# Patient Record
Sex: Male | Born: 1945 | Race: White | Hispanic: No | Marital: Married | Smoking: Never smoker
Health system: Southern US, Community
[De-identification: ages and names within clinical notes are randomized; demographics above are authoritative.]

## PROBLEM LIST (undated history)

## (undated) DIAGNOSIS — I10 Essential (primary) hypertension: Secondary | ICD-10-CM

## (undated) DIAGNOSIS — C801 Malignant (primary) neoplasm, unspecified: Secondary | ICD-10-CM

## (undated) DIAGNOSIS — E7800 Pure hypercholesterolemia, unspecified: Secondary | ICD-10-CM

## (undated) DIAGNOSIS — G629 Polyneuropathy, unspecified: Secondary | ICD-10-CM

## (undated) DIAGNOSIS — I4891 Unspecified atrial fibrillation: Secondary | ICD-10-CM

## (undated) HISTORY — PX: PENILE PROSTHESIS IMPLANT: SHX240

## (undated) HISTORY — PX: KNEE ARTHROSCOPY W/ MENISCAL REPAIR: SHX1877

## (undated) HISTORY — PX: REPLACEMENT TOTAL KNEE BILATERAL: SUR1225

---

## 2015-03-16 DIAGNOSIS — G629 Polyneuropathy, unspecified: Secondary | ICD-10-CM | POA: Insufficient documentation

## 2015-03-16 DIAGNOSIS — I4819 Other persistent atrial fibrillation: Secondary | ICD-10-CM | POA: Insufficient documentation

## 2015-03-29 ENCOUNTER — Ambulatory Visit: Payer: Self-pay | Attending: Specialist

## 2015-03-29 DIAGNOSIS — G4733 Obstructive sleep apnea (adult) (pediatric): Secondary | ICD-10-CM | POA: Insufficient documentation

## 2015-04-10 DIAGNOSIS — G4733 Obstructive sleep apnea (adult) (pediatric): Secondary | ICD-10-CM | POA: Insufficient documentation

## 2015-05-03 ENCOUNTER — Encounter: Payer: Self-pay | Admitting: Urology

## 2015-05-03 ENCOUNTER — Ambulatory Visit: Payer: Self-pay

## 2015-06-07 DIAGNOSIS — G5603 Carpal tunnel syndrome, bilateral upper limbs: Secondary | ICD-10-CM | POA: Insufficient documentation

## 2015-09-22 ENCOUNTER — Emergency Department
Admission: EM | Admit: 2015-09-22 | Discharge: 2015-09-22 | Disposition: A | Discharge status: Other Acute Inpatient Hospital | Payer: Medicare Other | Attending: Emergency Medicine | Admitting: Emergency Medicine

## 2015-09-22 ENCOUNTER — Emergency Department: Payer: Medicare Other

## 2015-09-22 DIAGNOSIS — I10 Essential (primary) hypertension: Secondary | ICD-10-CM | POA: Diagnosis not present

## 2015-09-22 DIAGNOSIS — M79641 Pain in right hand: Secondary | ICD-10-CM | POA: Diagnosis present

## 2015-09-22 DIAGNOSIS — L03113 Cellulitis of right upper limb: Secondary | ICD-10-CM | POA: Diagnosis not present

## 2015-09-22 HISTORY — DX: Essential (primary) hypertension: I10

## 2015-09-22 HISTORY — DX: Polyneuropathy, unspecified: G62.9

## 2015-09-22 HISTORY — DX: Pure hypercholesterolemia, unspecified: E78.00

## 2015-09-22 LAB — CBC WITH DIFFERENTIAL/PLATELET
BASOS ABS: 0 10*3/uL (ref 0–0.1)
BASOS PCT: 1 %
EOS ABS: 0.1 10*3/uL (ref 0–0.7)
Eosinophils Relative: 2 %
HEMATOCRIT: 36.7 % — AB (ref 40.0–52.0)
HEMOGLOBIN: 12.6 g/dL — AB (ref 13.0–18.0)
Lymphocytes Relative: 9 %
Lymphs Abs: 0.7 10*3/uL — ABNORMAL LOW (ref 1.0–3.6)
MCH: 30.4 pg (ref 26.0–34.0)
MCHC: 34.5 g/dL (ref 32.0–36.0)
MCV: 88 fL (ref 80.0–100.0)
MONO ABS: 0.7 10*3/uL (ref 0.2–1.0)
MONOS PCT: 9 %
NEUTROS ABS: 6.3 10*3/uL (ref 1.4–6.5)
NEUTROS PCT: 79 %
Platelets: 297 10*3/uL (ref 150–440)
RBC: 4.16 MIL/uL — ABNORMAL LOW (ref 4.40–5.90)
RDW: 13.3 % (ref 11.5–14.5)
WBC: 7.9 10*3/uL (ref 3.8–10.6)

## 2015-09-22 LAB — COMPREHENSIVE METABOLIC PANEL
ALBUMIN: 3.6 g/dL (ref 3.5–5.0)
ALT: 27 U/L (ref 17–63)
ANION GAP: 9 (ref 5–15)
AST: 30 U/L (ref 15–41)
Alkaline Phosphatase: 63 U/L (ref 38–126)
BILIRUBIN TOTAL: 1.1 mg/dL (ref 0.3–1.2)
BUN: 17 mg/dL (ref 6–20)
CHLORIDE: 96 mmol/L — AB (ref 101–111)
CO2: 34 mmol/L — AB (ref 22–32)
Calcium: 9.5 mg/dL (ref 8.9–10.3)
Creatinine, Ser: 1.33 mg/dL — ABNORMAL HIGH (ref 0.61–1.24)
GFR calc Af Amer: >60 mL/min (ref >60)
GFR calc non Af Amer: 53 mL/min — ABNORMAL LOW (ref >60)
GLUCOSE: 91 mg/dL (ref 65–99)
POTASSIUM: 3.1 mmol/L — AB (ref 3.5–5.1)
SODIUM: 139 mmol/L (ref 135–145)
Total Protein: 7.6 g/dL (ref 6.5–8.1)

## 2015-09-22 MED ORDER — MORPHINE SULFATE (PF) 4 MG/ML IV SOLN
4.0000 mg | Freq: Once | INTRAVENOUS | Status: AC
  Administered 2015-09-22: 4 mg via INTRAVENOUS
  Filled 2015-09-22: qty 1

## 2015-09-22 MED ORDER — SODIUM CHLORIDE 0.9 % IV BOLUS (SEPSIS)
1000.0000 mL | Freq: Once | INTRAVENOUS | Status: AC
  Administered 2015-09-22: 1000 mL via INTRAVENOUS

## 2015-09-22 MED ORDER — CEFAZOLIN SODIUM 1-5 GM-% IV SOLN
1.0000 g | Freq: Once | INTRAVENOUS | Status: AC
  Administered 2015-09-22: 1 g via INTRAVENOUS
  Filled 2015-09-22: qty 50

## 2015-09-22 MED ORDER — ONDANSETRON HCL 4 MG/2ML IJ SOLN
4.0000 mg | Freq: Once | INTRAMUSCULAR | Status: AC
  Administered 2015-09-22: 4 mg via INTRAVENOUS
  Filled 2015-09-22: qty 2

## 2015-09-22 NOTE — ED Notes (Signed)
Report given to [facility]. Swelling to hand and wrist noted to be much better. Decreased swelling and hand no longer white. Pt able to move wrist now

## 2015-09-22 NOTE — ED Notes (Signed)
Pt's fingers of R hand noted to be warmer than on first assessment. Cap refill > 3 seconds, no change in pt ability to feel sensation, pulses 2+. Pt maintains minimal movement at this time.

## 2015-09-22 NOTE — ED Notes (Addendum)
Right hand swelling and pain. Pt right hand was pierced with barb wire on Wednesday. Pt states he was seen at ER in [place] yesterday, xray, tetanus shot and given antibiotics. Xray taken. Pt alert and oriented X4, active, cooperative, pt in NAD. RR even and unlabored, color WNL.

## 2015-09-22 NOTE — ED Notes (Signed)
Pt's fingers warm, cap refill about 2 sec, sensation intact, limited movement due to swelling

## 2015-09-22 NOTE — ED Notes (Addendum)
Right radial pulse palpable, moderate. Full ROM, sensation, cap refill approx 2 seconds. Pt states that swelling is no worse than yesterday but that the pain has gotten worse.

## 2015-09-22 NOTE — ED Provider Notes (Signed)
[facility] Emergency Department Provider Note  ____________________________________________  Time seen: Approximately 5:47 PM  I have reviewed the triage vital signs and the nursing notes.   HISTORY  Chief Complaint Hand Pain    HPI Patrick Yang is a 69 y.o. male who presents emergency department for worsening of his right hand swelling. Per the patient he was injured 3 days ago on fresh section of barb wire. He states that he was trying to run a fence line when the barb are retracted catching the dorsal aspect of his right hand. Initially the patient did not seek medical care was able control all symptoms with over-the-counter medications. The patient states that the area began to become "red and swollen" and he proceeded to be evaluated in the emergency department at [facility]. His hand was x-rayed and resulted negative for fractures or foreign body. Patient was given an updated tetanus booster as well as oral antibiotics (levaquin). Patient states over the last 24 hours the swelling has greatly increased as well as the pain. He states he is no longer able to move his digits. He reports decreased sensation to the digits as well. He denies any fevers or chills, headaches, neck pain, chest pain, shortness breath, abdominal pain, nausea or vomiting.He reports that the pain is "out of this world" severe, constant, unrelieved by any medications.   Past Medical History  Diagnosis Date  . Hypertension   . Neuropathy ([place])   . Hypercholesteremia     There are no active problems to display for this patient.   History reviewed. No pertinent past surgical history.  No current outpatient prescriptions on file.  Allergies Review of patient's allergies indicates no known allergies.  No family history on file.  Social History Social History  Substance Use Topics  . Smoking status: Never Smoker   . Smokeless tobacco: None  . Alcohol Use: No    Review of  Systems Constitutional: No fever/chills Eyes: No visual changes. ENT: No sore throat. Cardiovascular: Denies chest pain. Respiratory: Denies shortness of breath. Gastrointestinal: No abdominal pain.  No nausea, no vomiting.  No diarrhea.  No constipation. Genitourinary: Negative for dysuria. Musculoskeletal: Negative for back pain. Skin: Negative for rash. Endorses right hand swelling with wounds to the dorsal aspect of right hand. Neurological: Negative for headaches, focal weakness or numbness.  10-point ROS otherwise negative.  ____________________________________________   PHYSICAL EXAM:  VITAL SIGNS: ED Triage Vitals  Enc Vitals Group     BP 09/22/15 1628 143/77 mmHg     Pulse Rate 09/22/15 1628 70     Resp 09/22/15 1628 18     Temp 09/22/15 1628 98.2 F (36.8 C)     Temp Source 09/22/15 1628 Oral     SpO2 09/22/15 1628 97 %     Weight 09/22/15 1628 245 lb (111.131 kg)     Height 09/22/15 1628 5\' 11"  (1.803 m)     Head Cir --      Peak Flow --      Pain Score 09/22/15 1629 8     Pain Loc --      Pain Edu? --      Excl. in [place]? --     Constitutional: Alert and oriented. Well appearing and in no acute distress. Eyes: Conjunctivae are normal. PERRL. EOMI. Head: Atraumatic. Nose: No congestion/rhinnorhea. Mouth/Throat: Mucous membranes are moist.  Oropharynx non-erythematous. Neck: No stridor.   Cardiovascular: Normal rate, regular rhythm. Grossly normal heart sounds.  Good peripheral circulation. Respiratory:  Normal respiratory effort.  No retractions. Lungs CTAB. Gastrointestinal: Soft and nontender. No distention. No abdominal bruits. No CVA tenderness. Musculoskeletal: No lower extremity tenderness nor edema.  No joint effusions. Neurologic:  Normal speech and language. No gross focal neurologic deficits are appreciated. No gait instability. Skin:  Skin is warm, dry and intact. No rash noted. There is gross edema noted to the right hand from the PIP joint of all  digits extending to midforearm. Edema is circumferential in nature. There are scabbed wounds noted to the dorsal and medial aspect of his hand. Patient does not have any range of motion to his digits. Sensation is decreased when compared with left hand. Capillary refill is greater than 3 seconds. Radial pulse is barely palpated. Palpation to area elicits pain. No fluctuance noted. Multiple areas of would like density are palpated. Passive range of motion elicits severe pain. No streaking is noted. Psychiatric: Mood and affect are normal. Speech and behavior are normal.  ____________________________________________   LABS (all labs ordered are listed, but only abnormal results are displayed)  Labs Reviewed  CBC WITH DIFFERENTIAL/PLATELET - Abnormal; Notable for the following:    RBC 4.16 (*)    Hemoglobin 12.6 (*)    HCT 36.7 (*)    Lymphs Abs 0.7 (*)    All other components within normal limits  COMPREHENSIVE METABOLIC PANEL - Abnormal; Notable for the following:    Potassium 3.1 (*)    Chloride 96 (*)    CO2 34 (*)    Creatinine, Ser 1.33 (*)    GFR calc non Af Amer 53 (*)    All other components within normal limits   ____________________________________________  EKG   ____________________________________________  RADIOLOGY  Right hand x-ray Impression: Soft tissue swelling. No acute osseous abnormality. No foreign bodies.  Images were personally reviewed by myself. ____________________________________________   PROCEDURES  Procedure(s) performed: None  Critical Care performed: No  ____________________________________________   INITIAL IMPRESSION / ASSESSMENT AND PLAN / ED COURSE  Pertinent labs & imaging results that were available during my care of the patient were reviewed by me and considered in my medical decision making (see chart for details).  After initial assessment and evaluation, Dr. Cinda Quest was consulted and he personally came and saw the patient as  well. Patient's presentation was concerning for compartment syndrome to right hand. Swelling is circumferential, with decreased sensation and capillary refill. He should fingers are fixed in the extended position and he is unable to flex at this time. Patient has pain out of proportion. Multiple hard wood like sensations to palpation. At this time it was determined that patient would need to be transferred for further care. Upon discussion with [facility] transfer center, Dr. Sherlie Ban, the attending hand surgeon requested that the Orthopedic surgeon here be consulted for possible immediate surgical intervention. Dr. Rudene Christians was paged and presented to the emergency department and evaluate patient. He determined that this time the pressure did not need to be relieved surgically. He recommended further care at [facility]. Transfer center was contacted and arrangements are made to transfer patient to the [facility] for further evaluation and care.  In the emergency department the patient was given pain medication. IV as well as further antibiotics. Labs returned without any acute abnormality. X-ray returns with no acute osseous abnormalities, no retained foreign body, but significant soft tissue swelling. Patient will be diagnosed with cellulitis of the right hand.  All findings and diagnosis of this as well as treatment course has been relayed to  the transfer center. Patient is transferred to the care of [place] ground transport team.   ____________________________________________   FINAL CLINICAL IMPRESSION(S) / ED DIAGNOSES  Final diagnoses:  Cellulitis of right upper extremity      Darletta Moll, PA-C 09/22/15 2034  Nena Polio, MD 09/23/15 [phone]

## 2015-11-27 ENCOUNTER — Other Ambulatory Visit: Payer: Self-pay | Admitting: Internal Medicine

## 2015-11-27 DIAGNOSIS — I4891 Unspecified atrial fibrillation: Secondary | ICD-10-CM

## 2015-11-27 DIAGNOSIS — N183 Chronic kidney disease, stage 3 unspecified: Secondary | ICD-10-CM

## 2015-12-04 ENCOUNTER — Ambulatory Visit: Admission: RE | Admit: 2015-12-04 | Payer: Medicare Other | Source: Ambulatory Visit

## 2015-12-04 ENCOUNTER — Ambulatory Visit: Payer: Medicare Other

## 2015-12-04 ENCOUNTER — Ambulatory Visit: Payer: Medicare Other | Attending: Internal Medicine

## 2015-12-19 ENCOUNTER — Ambulatory Visit: Payer: Medicare Other

## 2015-12-24 ENCOUNTER — Ambulatory Visit: Admission: RE | Admit: 2015-12-24 | Payer: Medicare Other | Source: Ambulatory Visit

## 2015-12-25 ENCOUNTER — Ambulatory Visit
Admission: RE | Admit: 2015-12-25 | Discharge: 2015-12-25 | Disposition: A | Discharge status: Home / Self Care | Payer: No Typology Code available for payment source | Source: Ambulatory Visit | Attending: Internal Medicine | Admitting: Internal Medicine

## 2015-12-25 ENCOUNTER — Other Ambulatory Visit
Admission: RE | Admit: 2015-12-25 | Discharge: 2015-12-25 | Disposition: A | Discharge status: Home / Self Care | Payer: No Typology Code available for payment source | Source: Ambulatory Visit | Attending: Internal Medicine | Admitting: Internal Medicine

## 2015-12-25 DIAGNOSIS — N183 Chronic kidney disease, stage 3 unspecified: Secondary | ICD-10-CM

## 2015-12-25 DIAGNOSIS — I4891 Unspecified atrial fibrillation: Secondary | ICD-10-CM | POA: Diagnosis not present

## 2015-12-25 LAB — BASIC METABOLIC PANEL
ANION GAP: 9 (ref 5–15)
BUN: 14 mg/dL (ref 6–20)
CHLORIDE: 101 mmol/L (ref 101–111)
CO2: 30 mmol/L (ref 22–32)
Calcium: 9.6 mg/dL (ref 8.9–10.3)
Creatinine, Ser: 1.16 mg/dL (ref 0.61–1.24)
GFR calc Af Amer: >60 mL/min (ref >60)
GFR calc non Af Amer: >60 mL/min (ref >60)
GLUCOSE: 63 mg/dL — AB (ref 65–99)
POTASSIUM: 3.4 mmol/L — AB (ref 3.5–5.1)
Sodium: 140 mmol/L (ref 135–145)

## 2015-12-25 LAB — MAGNESIUM: Magnesium: 2 mg/dL (ref 1.7–2.4)

## 2015-12-25 NOTE — Progress Notes (Signed)
*  PRELIMINARY RESULTS* Echocardiogram 2D Echocardiogram has been performed.  Laqueta Jean Hege 12/25/2015, 10:31 AM

## 2016-02-08 DIAGNOSIS — M19031 Primary osteoarthritis, right wrist: Secondary | ICD-10-CM | POA: Insufficient documentation

## 2016-10-02 ENCOUNTER — Encounter
Admission: RE | Disposition: A | Discharge status: Home / Self Care | Payer: Self-pay | Source: Ambulatory Visit | Attending: Cardiology

## 2016-10-02 ENCOUNTER — Ambulatory Visit
Admission: RE | Admit: 2016-10-02 | Discharge: 2016-10-02 | Disposition: A | Discharge status: Home / Self Care | Payer: Medicare Other | Source: Ambulatory Visit | Attending: Cardiology | Admitting: Cardiology

## 2016-10-02 ENCOUNTER — Ambulatory Visit: Payer: Medicare Other | Admitting: Certified Registered Nurse Anesthetist

## 2016-10-02 ENCOUNTER — Encounter: Payer: Self-pay | Admitting: *Deleted

## 2016-10-02 DIAGNOSIS — E7800 Pure hypercholesterolemia, unspecified: Secondary | ICD-10-CM | POA: Insufficient documentation

## 2016-10-02 DIAGNOSIS — Z79899 Other long term (current) drug therapy: Secondary | ICD-10-CM | POA: Diagnosis not present

## 2016-10-02 DIAGNOSIS — I2510 Atherosclerotic heart disease of native coronary artery without angina pectoris: Secondary | ICD-10-CM | POA: Insufficient documentation

## 2016-10-02 DIAGNOSIS — Z8546 Personal history of malignant neoplasm of prostate: Secondary | ICD-10-CM | POA: Insufficient documentation

## 2016-10-02 DIAGNOSIS — Z7982 Long term (current) use of aspirin: Secondary | ICD-10-CM | POA: Diagnosis not present

## 2016-10-02 DIAGNOSIS — I4891 Unspecified atrial fibrillation: Secondary | ICD-10-CM | POA: Diagnosis present

## 2016-10-02 DIAGNOSIS — I10 Essential (primary) hypertension: Secondary | ICD-10-CM | POA: Insufficient documentation

## 2016-10-02 DIAGNOSIS — G629 Polyneuropathy, unspecified: Secondary | ICD-10-CM | POA: Diagnosis not present

## 2016-10-02 DIAGNOSIS — I480 Paroxysmal atrial fibrillation: Secondary | ICD-10-CM | POA: Insufficient documentation

## 2016-10-02 DIAGNOSIS — Z7901 Long term (current) use of anticoagulants: Secondary | ICD-10-CM | POA: Diagnosis not present

## 2016-10-02 DIAGNOSIS — Z96659 Presence of unspecified artificial knee joint: Secondary | ICD-10-CM | POA: Insufficient documentation

## 2016-10-02 DIAGNOSIS — G4730 Sleep apnea, unspecified: Secondary | ICD-10-CM | POA: Insufficient documentation

## 2016-10-02 DIAGNOSIS — E785 Hyperlipidemia, unspecified: Secondary | ICD-10-CM | POA: Diagnosis not present

## 2016-10-02 DIAGNOSIS — J431 Panlobular emphysema: Secondary | ICD-10-CM | POA: Insufficient documentation

## 2016-10-02 HISTORY — PX: ELECTROPHYSIOLOGIC STUDY: SHX172A

## 2016-10-02 HISTORY — DX: Unspecified atrial fibrillation: I48.91

## 2016-10-02 SURGERY — CARDIOVERSION (CATH LAB)
Anesthesia: Choice

## 2016-10-02 SURGERY — CARDIOVERSION (CATH LAB)
Anesthesia: General

## 2016-10-02 MED ORDER — SODIUM CHLORIDE 0.9 % IV SOLN
INTRAVENOUS | Status: DC
  Administered 2016-10-02 (×2): via INTRAVENOUS

## 2016-10-02 MED ORDER — ONDANSETRON HCL 4 MG/2ML IJ SOLN: 4.0000 mg | Freq: Once | INTRAMUSCULAR | Status: DC | PRN

## 2016-10-02 MED ORDER — PROPOFOL 10 MG/ML IV BOLUS
INTRAVENOUS | Status: DC | PRN
  Administered 2016-10-02: 10 mg via INTRAVENOUS
  Administered 2016-10-02: 40 mg via INTRAVENOUS

## 2016-10-02 MED ORDER — PROPOFOL 10 MG/ML IV BOLUS
INTRAVENOUS | Status: AC
  Filled 2016-10-02: qty 20

## 2016-10-02 MED ORDER — FENTANYL CITRATE (PF) 100 MCG/2ML IJ SOLN: 25.0000 ug | INTRAMUSCULAR | Status: DC | PRN

## 2016-10-02 NOTE — Anesthesia Postprocedure Evaluation (Signed)
Anesthesia Post Note  Patient: Patrick Bubier Sr.  Procedure(s) Performed: Procedure(s) (LRB): CARDIOVERSION (N/A)  Patient location during evaluation: PACU Anesthesia Type: General Level of consciousness: awake and alert and oriented Pain management: pain level controlled Vital Signs Assessment: post-procedure vital signs reviewed and stable Respiratory status: spontaneous breathing Cardiovascular status: blood pressure returned to baseline Anesthetic complications: no     Last Vitals:  Vitals:   10/02/16 0750 10/02/16 0800  BP: 122/79 126/73  Pulse: (!) 46 (!) 49  Resp: 13 16  Temp:      Last Pain: There were no vitals filed for this visit.               [name]

## 2016-10-02 NOTE — Transfer of Care (Signed)
Immediate Anesthesia Transfer of Care Note  Patient: Patrick Griffins Sr.  Procedure(s) Performed: Procedure(s): CARDIOVERSION (N/A)  Patient Location: PACU  Anesthesia Type:General  Level of Consciousness: sedated  Airway & Oxygen Therapy: Patient Spontanous Breathing and Patient connected to nasal cannula oxygen  Post-op Assessment: Report given to RN and Post -op Vital signs reviewed and stable  Post vital signs: Reviewed and stable  Last Vitals:  Vitals:   10/02/16 0654 10/02/16 0740  BP: 137/89 133/65  Pulse: (!) 48 (!) 56  Resp: 18 (!) 9  Temp: 36.6 C     Last Pain: There were no vitals filed for this visit.       Complications: No apparent anesthesia complications

## 2016-10-02 NOTE — Procedures (Signed)
Procedure:  DCCV Indication: symptomatic afib Sedation: Propofol deep sedation per Dept. Of ANesthesia  After informed consent, time out protocol and adequate sedation, patient received a 120J synchronized Biphasic DC shock to sinus rhythm. No immediate complications.

## 2016-10-02 NOTE — Anesthesia Procedure Notes (Signed)
Date/Time: 10/02/2016 7:30 AM Performed by: Johnna Acosta Pre-anesthesia Checklist: Patient identified, Emergency Drugs available, Suction available, Patient being monitored and Timeout performed Patient Re-evaluated:Patient Re-evaluated prior to inductionOxygen Delivery Method: Nasal cannula

## 2016-10-02 NOTE — H&P (Signed)
Chief Complaint: Chief Complaint  Patient presents with  . Follow-up  talk about Cardioversion  . Shortness of Breath  labored shortness of breath  . Fatigue  Im always tired  . other  went to the [place] and blood pressures different in arms what could that be  Date of Service: 09/19/2016 Date of Birth: 1946-09-18 PCP: Idelle Crouch, MD, MD  History of Present Illness: Patrick Yang is a 70 y.o.male patient who has a history of atrial fibrillation, hypertension and hyperlipidemia who presents for follow-up visit. Patient has had intermittent atrial fibrillation. He had been cardioverted to sinus rhythm but at last visit was in atrial fibrillation. He also underwent a sleep study which suggested sleep apnea. He is attempting use CPAP for his sleep apnea. Electrocardiogram today reveals probable atrial fibrillation although baseline makes it somewhat difficult to determine. He has a right bundle-branch block. He remains on Xarelto for anticoagulation and amiodarone at 200 mg daily. His blood pressure is being aggressively controlled with spironolactone-hydrochlorothiazide, Hytrin, labetalol, minoxidil, amlodipine.  Past Medical and Surgical History  Past Medical History Past Medical History:  Diagnosis Date  . ASCVD (arteriosclerotic cardiovascular disease)  . COPD (chronic obstructive pulmonary disease) , unspecified (CMS-HCC)  . Hyperlipidemia, unspecified  . Hypertension  . Neuropathy  . Prostate cancer (CMS-HCC)  . Sleep apnea   Past Surgical History He has a past surgical history that includes Replacement total knee; Joint replacement; insertion penile prosthesis; Prostate Brachy Therapy; Colonoscopy; and carpectomy (Right, 02/27/2016).   Medications and Allergies  Current Medications  Current Outpatient Prescriptions  Medication Sig Dispense Refill  . amitriptyline (ELAVIL) 25 MG tablet Take 1 tablet (25 mg total) by mouth nightly. 30 tablet 11  . amLODIPine (NORVASC) 10 MG  tablet Take 1 tablet (10 mg total) by mouth 2 (two) times daily. (Patient taking differently: Take 10 mg by mouth once daily.  ) 60 tablet 5  . aspirin 81 MG EC tablet Take 81 mg by mouth once daily.  . hydrALAZINE (APRESOLINE) 50 MG tablet Take 50 mg by mouth 3 (three) times daily.  . hydroCHLOROthiazide (MICROZIDE) 12.5 mg capsule Take 12.5 mg by mouth once daily.  Marland Kitchen omeprazole (PRILOSEC OTC) 20 MG tablet Take 20 mg by mouth once daily.  . pravastatin (PRAVACHOL) 80 MG tablet Take 80 mg by mouth nightly.  . pregabalin (LYRICA) 150 MG capsule Take 150 mg by mouth 2 (two) times daily.  . rivaroxaban (XARELTO) 20 mg tablet Take 20 mg by mouth daily with dinner.  . sertraline (ZOLOFT) 100 MG tablet Take 100 mg by mouth once daily.  Marland Kitchen spironolactone (ALDACTONE) 25 MG tablet Take 25 mg by mouth once daily.  Marland Kitchen terazosin (HYTRIN) 10 MG capsule Take 10 mg by mouth nightly.  . zolpidem (AMBIEN) 10 mg tablet Take 10 mg by mouth nightly as needed for Sleep.  Marland Kitchen albuterol (VOSPIRE ER) 8 MG 12 hr tablet Take 8 mg by mouth every 12 (twelve) hours.  Marland Kitchen diltiazem (CARDIZEM CD) 120 MG XR capsule Take 1 capsule (120 mg total) by mouth once daily. 30 capsule 11   No current facility-administered medications for this visit.   Allergies: Patient has no known allergies.  Social and Family History  Social History reports that he has never smoked. He has never used smokeless tobacco. He reports that he does not drink alcohol or use drugs.  Family History Family History  Problem Relation Age of Onset  . Lung cancer Mother  . Lung cancer Father  .  Anesthesia problems Neg Hx   Review of Systems  Review of Systems  Constitutional: Negative for chills, diaphoresis, fever, malaise/fatigue and weight loss.  HENT: Negative for congestion, ear discharge, hearing loss and tinnitus.  Eyes: Negative for blurred vision.  Respiratory: Positive for shortness of breath. Negative for cough, hemoptysis, sputum production  and wheezing.  Cardiovascular: Negative for chest pain, palpitations, orthopnea, claudication, leg swelling and PND.  Gastrointestinal: Negative for abdominal pain, blood in stool, constipation, diarrhea, heartburn, melena, nausea and vomiting.  Genitourinary: Negative for dysuria, frequency, hematuria and urgency.  Musculoskeletal: Negative for back pain, falls, joint pain and myalgias.  Skin: Negative for itching and rash.  Neurological: Negative for dizziness, tingling, focal weakness, loss of consciousness, weakness and headaches.  Endo/Heme/Allergies: Negative for polydipsia. Does not bruise/bleed easily.  Psychiatric/Behavioral: Negative for depression, memory loss and substance abuse. The patient is not nervous/anxious.    Physical Examination   Vitals: BP 132/70  Pulse 64  Ht 182.9 cm (6')  Wt (!) 114.6 kg (252 lb 9.6 oz)  BMI 34.26 kg/m  Ht:182.9 cm (6') Wt:(!) 114.6 kg (252 lb 9.6 oz) [id number] surface area is 2.41 meters squared. Body mass index is 34.26 kg/m.  Wt Readings from Last 3 Encounters:  09/19/16 (!) 114.6 kg (252 lb 9.6 oz)  04/10/16 (!) 112.5 kg (248 lb)  03/13/16 (!) 112.5 kg (248 lb)   BP Readings from Last 3 Encounters:  09/19/16 132/70  04/10/16 126/80  03/13/16 143/81   General appearance appears in no acute distress  Head Mouth and Eye exam Normocephalic, without obvious abnormality, atraumatic Dentition is good Eyes appear anicteric   Neck exam Thyroid: normal  Nodes: no obvious adenopathy  LUNGS Breath Sounds: Normal Percussion: Normal  CARDIOVASCULAR JVP CV wave: no HJR: no Elevation at 90 degrees: None Carotid Pulse: normal pulsation bilaterally Bruit: None Apex: apical impulse normal  Auscultation Rhythm: normal sinus rhythm S1: normal S2: normal Clicks: no Rub: no Murmurs: no murmurs  Gallop: None ABDOMEN Liver enlargement: no Pulsatile aorta: no Ascites: no Bruits: no  EXTREMITIES Clubbing: no Edema:  trace to 1+ bilateral pedal edema Pulses: peripheral pulses symmetrical Femoral Bruits: no Amputation: no SKIN Rash: no Cyanosis: no Embolic phemonenon: no Bruising: no NEURO Alert and Oriented to person, place and time: yes Non focal: yes  PSYCH: Pt appears to have normal affect  LABS REVIEWED  Lab Results  Component Value Date  CREATININE 1.1 09/27/2015  BUN 10 09/27/2015  NA 142 09/27/2015  K 3.4 (L) 09/27/2015  CL 99 09/27/2015  CO2 36 (H) 09/27/2015   Lab Results  Component Value Date  ALT 35 07/09/2015  AST 33 07/09/2015  ALKPHOS 52 07/09/2015   Lab Results  Component Value Date  TSH 1.825 03/26/2015   Assessment and Plan   70 y.o. male with  ICD-10-CM ICD-9-CM  1. Paroxysmal a-fib-currently is in sinus rhythm. He remains on amiodarone and Xarelto at 20 mg daily. Will continue with Xarelto at this dose and follow. Continue with CPAP. Consider proceeding with cardioversion. I48.0 427.31      2. ASCVD (arteriosclerotic cardiovascular disease) I25.10 429.2  440.9  3. Pure hypercholesterolemia E78.0 272.0  4. Essential hypertension with goal blood pressure less than 140/90 I10 401.9  5. PAF (paroxysmal atrial fibrillation) I48.0 427.31  6. 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 Sleep apnea-patient will need CPAP for his sleep apnea which was diagnosed by a sleep study.  Return in about 3 weeks (around 10/10/2016).  These notes generated with  voice recognition software. I apologize for typographical errors.  Sydnee Levans, MD    H and P reviewed. No change

## 2016-10-02 NOTE — Anesthesia Preprocedure Evaluation (Addendum)
Anesthesia Evaluation  Patient identified by MRN, date of birth, ID band Patient awake    Reviewed: Allergy & Precautions, NPO status , Patient's Chart, lab work & pertinent test results, reviewed documented beta blocker date and time   Airway Mallampati: III       Dental  (+) Partial Upper   Pulmonary neg pulmonary ROS,    Pulmonary exam normal        Cardiovascular hypertension, Pt. on medications and Pt. on home beta blockers Normal cardiovascular exam+ dysrhythmias Atrial Fibrillation      Neuro/Psych Peripheral neuropathy negative psych ROS   GI/Hepatic negative GI ROS, Neg liver ROS,   Endo/Other  negative endocrine ROS  Renal/GU negative Renal ROS  negative genitourinary   Musculoskeletal negative musculoskeletal ROS (+)   Abdominal Normal abdominal exam  (+)   Peds negative pediatric ROS (+)  Hematology negative hematology ROS (+)   Anesthesia Other Findings   Reproductive/Obstetrics                            Anesthesia Physical Anesthesia Plan  ASA: III  Anesthesia Plan: General   Post-op Pain Management:    Induction: Intravenous  Airway Management Planned: Nasal Cannula  Additional Equipment:   Intra-op Plan:   Post-operative Plan:   Informed Consent: I have reviewed the patients History and Physical, chart, labs and discussed the procedure including the risks, benefits and alternatives for the proposed anesthesia with the patient or authorized representative who has indicated his/her understanding and acceptance.   Dental advisory given  Plan Discussed with: CRNA and Surgeon  Anesthesia Plan Comments:         Anesthesia Quick Evaluation

## 2016-10-03 NOTE — Discharge Summary (Signed)
Cardioverted to [facility]. OK for discharge. Follow up in 1 week.

## 2016-12-19 DIAGNOSIS — I2510 Atherosclerotic heart disease of native coronary artery without angina pectoris: Secondary | ICD-10-CM | POA: Insufficient documentation

## 2017-02-04 DIAGNOSIS — Z7901 Long term (current) use of anticoagulants: Secondary | ICD-10-CM | POA: Insufficient documentation

## 2017-07-20 ENCOUNTER — Inpatient Hospital Stay
Admission: EM | Admit: 2017-07-20 | Discharge: 2017-07-22 | DRG: 684 | Disposition: A | Discharge status: Home / Self Care | Payer: Medicare Other | Attending: Internal Medicine | Admitting: Internal Medicine

## 2017-07-20 ENCOUNTER — Emergency Department: Payer: Medicare Other

## 2017-07-20 DIAGNOSIS — E860 Dehydration: Secondary | ICD-10-CM | POA: Diagnosis present

## 2017-07-20 DIAGNOSIS — Z801 Family history of malignant neoplasm of trachea, bronchus and lung: Secondary | ICD-10-CM

## 2017-07-20 DIAGNOSIS — K279 Peptic ulcer, site unspecified, unspecified as acute or chronic, without hemorrhage or perforation: Secondary | ICD-10-CM | POA: Diagnosis present

## 2017-07-20 DIAGNOSIS — I482 Chronic atrial fibrillation: Secondary | ICD-10-CM | POA: Diagnosis present

## 2017-07-20 DIAGNOSIS — I959 Hypotension, unspecified: Secondary | ICD-10-CM | POA: Diagnosis present

## 2017-07-20 DIAGNOSIS — I129 Hypertensive chronic kidney disease with stage 1 through stage 4 chronic kidney disease, or unspecified chronic kidney disease: Secondary | ICD-10-CM | POA: Diagnosis present

## 2017-07-20 DIAGNOSIS — N183 Chronic kidney disease, stage 3 (moderate): Secondary | ICD-10-CM | POA: Diagnosis present

## 2017-07-20 DIAGNOSIS — Z7982 Long term (current) use of aspirin: Secondary | ICD-10-CM | POA: Diagnosis not present

## 2017-07-20 DIAGNOSIS — G8929 Other chronic pain: Secondary | ICD-10-CM | POA: Diagnosis present

## 2017-07-20 DIAGNOSIS — Z23 Encounter for immunization: Secondary | ICD-10-CM

## 2017-07-20 DIAGNOSIS — Z96653 Presence of artificial knee joint, bilateral: Secondary | ICD-10-CM | POA: Diagnosis present

## 2017-07-20 DIAGNOSIS — N400 Enlarged prostate without lower urinary tract symptoms: Secondary | ICD-10-CM | POA: Diagnosis present

## 2017-07-20 DIAGNOSIS — R531 Weakness: Secondary | ICD-10-CM

## 2017-07-20 DIAGNOSIS — N179 Acute kidney failure, unspecified: Secondary | ICD-10-CM | POA: Diagnosis present

## 2017-07-20 DIAGNOSIS — I480 Paroxysmal atrial fibrillation: Secondary | ICD-10-CM | POA: Diagnosis present

## 2017-07-20 DIAGNOSIS — G629 Polyneuropathy, unspecified: Secondary | ICD-10-CM | POA: Diagnosis present

## 2017-07-20 DIAGNOSIS — K5730 Diverticulosis of large intestine without perforation or abscess without bleeding: Secondary | ICD-10-CM | POA: Diagnosis present

## 2017-07-20 DIAGNOSIS — D72829 Elevated white blood cell count, unspecified: Secondary | ICD-10-CM | POA: Diagnosis present

## 2017-07-20 DIAGNOSIS — Z7901 Long term (current) use of anticoagulants: Secondary | ICD-10-CM

## 2017-07-20 DIAGNOSIS — E785 Hyperlipidemia, unspecified: Secondary | ICD-10-CM | POA: Diagnosis present

## 2017-07-20 DIAGNOSIS — E876 Hypokalemia: Secondary | ICD-10-CM | POA: Diagnosis present

## 2017-07-20 DIAGNOSIS — I2510 Atherosclerotic heart disease of native coronary artery without angina pectoris: Secondary | ICD-10-CM | POA: Diagnosis present

## 2017-07-20 DIAGNOSIS — I700 Atherosclerosis of aorta: Secondary | ICD-10-CM | POA: Diagnosis present

## 2017-07-20 DIAGNOSIS — N189 Chronic kidney disease, unspecified: Secondary | ICD-10-CM

## 2017-07-20 DIAGNOSIS — Z960 Presence of urogenital implants: Secondary | ICD-10-CM | POA: Diagnosis present

## 2017-07-20 DIAGNOSIS — E7800 Pure hypercholesterolemia, unspecified: Secondary | ICD-10-CM | POA: Diagnosis present

## 2017-07-20 DIAGNOSIS — Z9884 Bariatric surgery status: Secondary | ICD-10-CM | POA: Diagnosis not present

## 2017-07-20 HISTORY — DX: Malignant (primary) neoplasm, unspecified: C80.1

## 2017-07-20 LAB — CBC
HCT: 43.2 % (ref 40.0–52.0)
Hemoglobin: 15.1 g/dL (ref 13.0–18.0)
MCH: 31.5 pg (ref 26.0–34.0)
MCHC: 34.9 g/dL (ref 32.0–36.0)
MCV: 90.2 fL (ref 80.0–100.0)
PLATELETS: 261 10*3/uL (ref 150–440)
RBC: 4.79 MIL/uL (ref 4.40–5.90)
RDW: 13.5 % (ref 11.5–14.5)
WBC: 12 10*3/uL — ABNORMAL HIGH (ref 3.8–10.6)

## 2017-07-20 LAB — BASIC METABOLIC PANEL
ANION GAP: 16 — AB (ref 5–15)
BUN: 47 mg/dL — ABNORMAL HIGH (ref 6–20)
CALCIUM: 10.4 mg/dL — AB (ref 8.9–10.3)
CO2: 26 mmol/L (ref 22–32)
Chloride: 95 mmol/L — ABNORMAL LOW (ref 101–111)
Creatinine, Ser: 2.44 mg/dL — ABNORMAL HIGH (ref 0.61–1.24)
GFR, EST AFRICAN AMERICAN: 29 mL/min — AB (ref >60)
GFR, EST NON AFRICAN AMERICAN: 25 mL/min — AB (ref >60)
Glucose, Bld: 116 mg/dL — ABNORMAL HIGH (ref 65–99)
Potassium: 3.2 mmol/L — ABNORMAL LOW (ref 3.5–5.1)
SODIUM: 137 mmol/L (ref 135–145)

## 2017-07-20 LAB — GLUCOSE, CAPILLARY: GLUCOSE-CAPILLARY: 110 mg/dL — AB (ref 65–99)

## 2017-07-20 LAB — MAGNESIUM: MAGNESIUM: 2.2 mg/dL (ref 1.7–2.4)

## 2017-07-20 MED ORDER — INFLUENZA VAC SPLIT HIGH-DOSE 0.5 ML IM SUSY
0.5000 mL | PREFILLED_SYRINGE | INTRAMUSCULAR | Status: AC
  Administered 2017-07-21: 0.5 mL via INTRAMUSCULAR
  Filled 2017-07-20 (×2): qty 0.5

## 2017-07-20 MED ORDER — ALBUTEROL SULFATE (2.5 MG/3ML) 0.083% IN NEBU: 2.5000 mg | INHALATION_SOLUTION | RESPIRATORY_TRACT | Status: DC | PRN

## 2017-07-20 MED ORDER — SERTRALINE HCL 50 MG PO TABS
100.0000 mg | ORAL_TABLET | Freq: Every day | ORAL | Status: DC
  Administered 2017-07-20 – 2017-07-22 (×3): 100 mg via ORAL
  Filled 2017-07-20 (×3): qty 2

## 2017-07-20 MED ORDER — ALPRAZOLAM 0.5 MG PO TABS: 0.5000 mg | ORAL_TABLET | Freq: Every evening | ORAL | Status: DC | PRN

## 2017-07-20 MED ORDER — ONDANSETRON HCL 4 MG/2ML IJ SOLN
INTRAMUSCULAR | Status: AC
  Administered 2017-07-20: 4 mg via INTRAVENOUS
  Filled 2017-07-20: qty 2

## 2017-07-20 MED ORDER — PREGABALIN 75 MG PO CAPS
150.0000 mg | ORAL_CAPSULE | Freq: Two times a day (BID) | ORAL | Status: DC
  Administered 2017-07-20 – 2017-07-22 (×4): 150 mg via ORAL
  Filled 2017-07-20 (×4): qty 2

## 2017-07-20 MED ORDER — BISACODYL 5 MG PO TBEC: 5.0000 mg | DELAYED_RELEASE_TABLET | Freq: Every day | ORAL | Status: DC | PRN

## 2017-07-20 MED ORDER — AMITRIPTYLINE HCL 25 MG PO TABS
25.0000 mg | ORAL_TABLET | Freq: Every evening | ORAL | Status: DC
  Administered 2017-07-20 – 2017-07-21 (×2): 25 mg via ORAL
  Filled 2017-07-20 (×4): qty 1

## 2017-07-20 MED ORDER — CYCLOBENZAPRINE HCL 10 MG PO TABS: 10.0000 mg | ORAL_TABLET | Freq: Three times a day (TID) | ORAL | Status: DC | PRN

## 2017-07-20 MED ORDER — LORATADINE 10 MG PO TABS
10.0000 mg | ORAL_TABLET | Freq: Every day | ORAL | Status: DC
  Administered 2017-07-21 – 2017-07-22 (×2): 10 mg via ORAL
  Filled 2017-07-20 (×2): qty 1

## 2017-07-20 MED ORDER — SODIUM CHLORIDE 0.9 % IV SOLN
Freq: Once | INTRAVENOUS | Status: AC
  Administered 2017-07-20: 19:00:00 via INTRAVENOUS

## 2017-07-20 MED ORDER — ASPIRIN EC 81 MG PO TBEC
81.0000 mg | DELAYED_RELEASE_TABLET | Freq: Every day | ORAL | Status: DC
  Administered 2017-07-21 – 2017-07-22 (×2): 81 mg via ORAL
  Filled 2017-07-20 (×2): qty 1

## 2017-07-20 MED ORDER — GABAPENTIN 300 MG PO CAPS
300.0000 mg | ORAL_CAPSULE | Freq: Three times a day (TID) | ORAL | Status: DC
  Administered 2017-07-20 – 2017-07-22 (×5): 300 mg via ORAL
  Filled 2017-07-20 (×5): qty 1

## 2017-07-20 MED ORDER — IOPAMIDOL (ISOVUE-300) INJECTION 61%: 15.0000 mL | INTRAVENOUS | Status: AC

## 2017-07-20 MED ORDER — SODIUM CHLORIDE 0.9 % IV BOLUS (SEPSIS)
1000.0000 mL | Freq: Once | INTRAVENOUS | Status: AC
  Administered 2017-07-20: 1000 mL via INTRAVENOUS

## 2017-07-20 MED ORDER — ONDANSETRON HCL 4 MG/2ML IJ SOLN: 4.0000 mg | Freq: Four times a day (QID) | INTRAMUSCULAR | Status: DC | PRN

## 2017-07-20 MED ORDER — ACETAMINOPHEN 650 MG RE SUPP: 650.0000 mg | Freq: Four times a day (QID) | RECTAL | Status: DC | PRN

## 2017-07-20 MED ORDER — SENNOSIDES-DOCUSATE SODIUM 8.6-50 MG PO TABS: 1.0000 | ORAL_TABLET | Freq: Every evening | ORAL | Status: DC | PRN

## 2017-07-20 MED ORDER — PRAVASTATIN SODIUM 20 MG PO TABS
80.0000 mg | ORAL_TABLET | Freq: Every day | ORAL | Status: DC
  Administered 2017-07-20 – 2017-07-21 (×2): 80 mg via ORAL
  Filled 2017-07-20 (×2): qty 4

## 2017-07-20 MED ORDER — TERAZOSIN HCL 5 MG PO CAPS
10.0000 mg | ORAL_CAPSULE | Freq: Every evening | ORAL | Status: DC
  Administered 2017-07-20 – 2017-07-21 (×2): 10 mg via ORAL
  Filled 2017-07-20 (×3): qty 2

## 2017-07-20 MED ORDER — ONDANSETRON HCL 4 MG PO TABS: 4.0000 mg | ORAL_TABLET | Freq: Four times a day (QID) | ORAL | Status: DC | PRN

## 2017-07-20 MED ORDER — ONDANSETRON HCL 4 MG/2ML IJ SOLN
4.0000 mg | Freq: Once | INTRAMUSCULAR | Status: AC
  Administered 2017-07-20 (×2): 4 mg via INTRAVENOUS

## 2017-07-20 MED ORDER — ACETAMINOPHEN 325 MG PO TABS: 650.0000 mg | ORAL_TABLET | Freq: Four times a day (QID) | ORAL | Status: DC | PRN

## 2017-07-20 MED ORDER — LABETALOL HCL 100 MG PO TABS
200.0000 mg | ORAL_TABLET | Freq: Two times a day (BID) | ORAL | Status: DC
  Administered 2017-07-20: 200 mg via ORAL
  Filled 2017-07-20 (×6): qty 2

## 2017-07-20 MED ORDER — PANTOPRAZOLE SODIUM 40 MG PO TBEC
40.0000 mg | DELAYED_RELEASE_TABLET | Freq: Every day | ORAL | Status: DC
  Administered 2017-07-20 – 2017-07-22 (×3): 40 mg via ORAL
  Filled 2017-07-20 (×3): qty 1

## 2017-07-20 MED ORDER — MORPHINE SULFATE (PF) 4 MG/ML IV SOLN
4.0000 mg | Freq: Once | INTRAVENOUS | Status: AC
  Administered 2017-07-20: 4 mg via INTRAVENOUS
  Filled 2017-07-20: qty 1

## 2017-07-20 MED ORDER — SODIUM CHLORIDE 0.9 % IV SOLN
INTRAVENOUS | Status: DC
  Administered 2017-07-20 – 2017-07-21 (×2): via INTRAVENOUS

## 2017-07-20 MED ORDER — AMIODARONE HCL 200 MG PO TABS
200.0000 mg | ORAL_TABLET | Freq: Every day | ORAL | Status: DC
  Administered 2017-07-22: 200 mg via ORAL
  Filled 2017-07-20 (×2): qty 1

## 2017-07-20 MED ORDER — ZOLPIDEM TARTRATE 5 MG PO TABS
5.0000 mg | ORAL_TABLET | Freq: Every evening | ORAL | Status: DC | PRN
  Administered 2017-07-20 – 2017-07-21 (×2): 5 mg via ORAL
  Filled 2017-07-20 (×2): qty 1

## 2017-07-20 MED ORDER — POTASSIUM CHLORIDE CRYS ER 20 MEQ PO TBCR
40.0000 meq | EXTENDED_RELEASE_TABLET | Freq: Once | ORAL | Status: AC
  Administered 2017-07-20: 40 meq via ORAL
  Filled 2017-07-20: qty 2

## 2017-07-20 MED ORDER — HYDROCODONE-ACETAMINOPHEN 5-325 MG PO TABS
1.0000 | ORAL_TABLET | ORAL | Status: DC | PRN
  Administered 2017-07-20 – 2017-07-21 (×3): 1 via ORAL
  Filled 2017-07-20 (×3): qty 1

## 2017-07-20 MED ORDER — DILTIAZEM HCL ER COATED BEADS 120 MG PO CP24
120.0000 mg | ORAL_CAPSULE | Freq: Every day | ORAL | Status: DC
  Administered 2017-07-22: 120 mg via ORAL
  Filled 2017-07-20 (×2): qty 1

## 2017-07-20 NOTE — H&P (Signed)
[facility] NAME: Patrick Yang    MR#:  [id number]  DATE OF BIRTH:  [birth year]  DATE OF ADMISSION:  07/20/2017  PRIMARY CARE PHYSICIAN: System, Pcp Not In   REQUESTING/REFERRING PHYSICIAN: Earleen Newport, MD  CHIEF COMPLAINT:   Chief Complaint  Patient presents with  . Weakness   Generalized weakness and back pain. HISTORY OF PRESENT ILLNESS:  Patrick Yang  is a 71 y.o. male with a known history of A. Fib, hypertension, hyperlipidemia, chronic back pain and neuropathy. The patient presently ED with the above chief complaints. The patient recently got gastric sleeve surgery, cholecystectomy and hernia repair in [facility]. He has been feeling weak after surgery. He also complains of chronic back pain. He was found renal failure and hypotension in the ED, given normal saline bolus.  PAST MEDICAL HISTORY:   Past Medical History:  Diagnosis Date  . Atrial fibrillation ([place])   . Hypercholesteremia   . Hypertension   . Neuropathy     PAST SURGICAL HISTORY:   Past Surgical History:  Procedure Laterality Date  . ELECTROPHYSIOLOGIC STUDY N/A 10/02/2016   Procedure: CARDIOVERSION;  Surgeon: Teodoro Spray, MD;  Location: [facility] ORS;  Service: Cardiovascular;  Laterality: N/A;  . KNEE ARTHROSCOPY W/ MENISCAL REPAIR     both knees  . PENILE PROSTHESIS IMPLANT    . REPLACEMENT TOTAL KNEE BILATERAL      SOCIAL HISTORY:   Social History  Substance Use Topics  . Smoking status: Never Smoker  . Smokeless tobacco: Not on file  . Alcohol use No    FAMILY HISTORY:   Family History  Problem Relation Age of Onset  . Lung cancer Mother   . Lung cancer Father     DRUG ALLERGIES:  No Known Allergies  REVIEW OF SYSTEMS:   Review of Systems  Constitutional: Positive for malaise/fatigue. Negative for chills and fever.  HENT: Negative for sore throat.   Eyes: Negative for blurred vision and double vision.    Respiratory: Negative for cough, hemoptysis, shortness of breath, wheezing and stridor.   Cardiovascular: Negative for chest pain, palpitations, orthopnea and leg swelling.  Gastrointestinal: Negative for abdominal pain, blood in stool, diarrhea, melena, nausea and vomiting.  Genitourinary: Negative for dysuria, flank pain and hematuria.  Musculoskeletal: Positive for back pain. Negative for joint pain.  Neurological: Positive for weakness. Negative for dizziness, sensory change, focal weakness, seizures, loss of consciousness and headaches.  Endo/Heme/Allergies: Negative for polydipsia.  Psychiatric/Behavioral: Negative for depression. The patient is not nervous/anxious.     MEDICATIONS AT HOME:   Prior to Admission medications   Medication Sig Start Date End Date Taking? Authorizing Provider  amiodarone (PACERONE) 200 MG tablet Take 200 mg by mouth daily.  05/23/15  Yes [provider]  amitriptyline (ELAVIL) 25 MG tablet Take 25 mg by mouth every evening. 05/23/15  Yes [provider]  amLODipine (NORVASC) 10 MG tablet Take 10 mg by mouth daily. 04/10/15  Yes [provider]  Ascorbic Acid (VITAMIN C PO) Take 1 tablet by mouth daily.   Yes [provider]  aspirin EC 81 MG tablet Take 81 mg by mouth daily.   Yes [provider]  CALCIUM PO Take 1 tablet by mouth daily.   Yes [provider]  Cyanocobalamin (VITAMIN B-12 PO) Take 1 tablet by mouth daily.   Yes [provider]  diltiazem (CARDIZEM CD) 120 MG 24 hr capsule Take  120 mg by mouth daily. 09/19/16  Yes [provider]  fexofenadine (ALLEGRA) 180 MG tablet Take 180 mg by mouth daily.   Yes [provider]  furosemide (LASIX) 40 MG tablet Take 40 mg by mouth daily.   Yes [provider]  gabapentin (NEURONTIN) 300 MG capsule Take 300 mg by mouth 3 (three) times daily. 07/23/15 09/24/17 Yes [provider]  hydrALAZINE (APRESOLINE) 50 MG  tablet Take 50 mg by mouth 2 (two) times daily.   Yes [provider]  labetalol (NORMODYNE) 200 MG tablet Take 200 mg by mouth 2 (two) times daily.   Yes [provider]  omeprazole (PRILOSEC) 20 MG capsule Take 20 mg by mouth daily.   Yes [provider]  potassium chloride (K-DUR) 10 MEQ tablet Take 60 mEq by mouth daily.    Yes [provider]  pravastatin (PRAVACHOL) 80 MG tablet Take 80 mg by mouth daily.   Yes [provider]  pregabalin (LYRICA) 150 MG capsule Take 150 mg by mouth 2 (two) times daily.   Yes [provider]  rivaroxaban (XARELTO) 20 MG TABS tablet Take 20 mg by mouth daily with supper.   Yes [provider]  sertraline (ZOLOFT) 100 MG tablet Take 100 mg by mouth daily.   Yes [provider]  spironolactone-hydrochlorothiazide (ALDACTAZIDE) 25-25 MG tablet Take 1 tablet by mouth daily.   Yes [provider]  terazosin (HYTRIN) 10 MG capsule Take 10 mg by mouth every evening.   Yes [provider]  zolpidem (AMBIEN) 10 MG tablet Take 10 mg by mouth daily as needed.   Yes [provider]  ALPRAZolam Duanne Moron) 0.5 MG tablet Take 0.5 mg by mouth at bedtime as needed for anxiety or sleep.     [provider]  cyclobenzaprine (FLEXERIL) 10 MG tablet Take 10 mg by mouth 3 (three) times daily as needed.    [provider]      VITAL SIGNS:  Blood pressure (!) 116/47, pulse 63, temperature 98.1 F (36.7 C), temperature source Oral, resp. rate 16, height 5\' 10"  (1.778 m), weight 228 lb (103.4 kg), SpO2 97 %.  PHYSICAL EXAMINATION:  Physical Exam  GENERAL:  71 y.o.-year-old patient lying in the bed with no acute distress. Morbid obesity. EYES: Pupils equal, round, reactive to light and accommodation. No scleral icterus. Extraocular muscles intact.  HEENT: Head atraumatic, normocephalic. Oropharynx and nasopharynx clear.  NECK:  Supple, no jugular venous distention.  No thyroid enlargement, no tenderness.  LUNGS: Normal breath sounds bilaterally, no wheezing, rales,rhonchi or crepitation. No use of accessory muscles of respiration.  CARDIOVASCULAR: S1, S2 normal. No murmurs, rubs, or gallops.  ABDOMEN: Soft, nontender, nondistended. Bowel sounds present. No organomegaly or mass.  EXTREMITIES: No pedal edema, cyanosis, or clubbing.  NEUROLOGIC: Cranial nerves II through XII are intact. Muscle strength 5/5 in all extremities. Sensation intact. Gait not checked.  PSYCHIATRIC: The patient is alert and oriented x 3.  SKIN: No obvious rash, lesion, or ulcer.   LABORATORY PANEL:   CBC  Recent Labs Lab 07/20/17 1617  WBC 12.0*  HGB 15.1  HCT 43.2  PLT 261   ------------------------------------------------------------------------------------------------------------------  Chemistries   Recent Labs Lab 07/20/17 1617  NA 137  K 3.2*  CL 95*  CO2 26  GLUCOSE 116*  BUN 47*  CREATININE 2.44*  CALCIUM 10.4*   ------------------------------------------------------------------------------------------------------------------  Cardiac Enzymes No results for input(s): TROPONINI in the last 168 hours. ------------------------------------------------------------------------------------------------------------------  RADIOLOGY:  Ct Abdomen  Pelvis Wo Contrast  Result Date: 07/20/2017 CLINICAL DATA:  71 year old male with recent history of gastric bypass surgery, cholecystectomy and hiatal hernia repair 10 days ago, now presenting with fatigue, weakness and hypertension. EXAM: CT ABDOMEN AND PELVIS WITHOUT CONTRAST TECHNIQUE: Multidetector CT imaging of the abdomen and pelvis was performed following the standard protocol without IV contrast. COMPARISON:  None. FINDINGS: Lower chest: Atherosclerotic calcifications in the right coronary artery. Calcifications of the aortic valve. Hepatobiliary: A few well-defined low-attenuation lesions are noted in the  liver, incompletely characterized on today's noncontrast CT examination, but likely to represent cysts, measuring up to 2.3 x 1.5 cm between segments 5 and 8. Status post cholecystectomy. Pancreas: No definite pancreatic mass or peripancreatic inflammatory changes are noted on today's noncontrast CT examination. Spleen: Multiple tiny calcified granulomas scattered throughout the spleen. Adrenals/Urinary Tract: Focal area of cortical thinning in the interpolar region of the right kidney laterally. Subcentimeter low-attenuation lesion in the lower pole the left kidney is incompletely characterized on today's noncontrast CT examination, but is likely a cyst. No hydroureteronephrosis. Urinary bladder is unremarkable in appearance. Bilateral adrenal glands are normal in appearance. Stomach/Bowel: Postoperative changes of recent sleeve gastrectomy are noted. No pathologic dilatation of small bowel or colon. Numerous colonic diverticulae are noted, particularly in the sigmoid colon, without surrounding inflammatory changes to suggest an acute diverticulitis at this time. Normal appendix. Vascular/Lymphatic: Aortic atherosclerosis, without evidence of aneurysm in the abdominal or pelvic vasculature. No lymphadenopathy noted in the abdomen or pelvis. Reproductive: Brachytherapy implants throughout the prostate gland. Seminal vesicles are unremarkable in appearance. Penile prosthesis with pump reservoir in the left side of the pelvis. Other: No significant volume of ascites.  No pneumoperitoneum. Musculoskeletal: There are no aggressive appearing lytic or blastic lesions noted in the visualized portions of the skeleton. IMPRESSION: 1. No acute findings are noted in the abdomen or pelvis to account for the patient's symptoms. 2. Colonic diverticulosis without evidence of acute diverticulitis at this time. 3. Aortic atherosclerosis, in addition to at least right coronary artery disease. Assessment for potential risk factor  modification, dietary therapy or pharmacologic therapy may be warranted, if clinically indicated. 4. There are calcifications of the aortic valve. Echocardiographic correlation for evaluation of potential valvular dysfunction may be warranted if clinically indicated. 5. Postoperative changes, as above. 6. Additional incidental findings, as above. Electronically Signed   By: Vinnie Langton M.D.   On: 07/20/2017 20:02      IMPRESSION AND PLAN:   Acute renal failure on CKD stage III. The patient will be admitted to medical floor. Hold spironolactone and HCTZ, start normal saline IV and follow-up BMP.  Hypotension. Hold hypertension medication, IV fluid support.  Chronic A. Fib. Continue Cardizem if BP allows. Xarelto pharmacy to dose.  Leukocytosis. Unclear etiology. Follow-up urine analysis and the CBC. Hypokalemia. Give potassium supplement, follow-up BMP and magnesium level.    All the records are reviewed and case discussed with ED provider. Management plans discussed with the patient, his wife and they are in agreement.  CODE STATUS: full code  TOTAL TIME TAKING CARE OF THIS PATIENT: 55 minutes.    Demetrios Loll M.D on 07/20/2017 at 9:06 PM  Between 7am to 6pm - Pager - [phone]  After 6pm go to [email or web address] - Proofreader  [facility]  Office  [phone]  CC: Primary care physician; System, Pcp Not In   Note: This dictation was prepared with Dragon dictation along with smaller phrase technology. Any transcriptional errors that  result from this process are unin

## 2017-07-20 NOTE — ED Notes (Signed)
Patient c/o nausea after taking a sip of oral contrast. Dr. Jimmye Norman is aware.

## 2017-07-20 NOTE — ED Provider Notes (Signed)
[facility] Emergency Department Provider Note       Time seen: ----------------------------------------- 6:45 PM on 07/20/2017 -----------------------------------------     I have reviewed the triage vital signs and the nursing notes.   HISTORY   Chief Complaint Weakness    HPI Patrick Forbush Sr. is a 71 y.o. male with a history of atrial fibrillation, high cholesterol and hypertension who presents to the ED for weakness, fatigue and hypotension at home. Patient arrives alert and oriented but feeling weak.patient states he is not really having abdominal pain and overall has had a good appetite. He is currently complaining mostly of a headache, states he has not had much to eat or drink since the surgery. Pain is 7 out of 10 in his head.  Past Medical History:  Diagnosis Date  . Atrial fibrillation ([place])   . Hypercholesteremia   . Hypertension   . Neuropathy     There are no active problems to display for this patient.   Past Surgical History:  Procedure Laterality Date  . ELECTROPHYSIOLOGIC STUDY N/A 10/02/2016   Procedure: CARDIOVERSION;  Surgeon: Teodoro Spray, MD;  Location: [facility] ORS;  Service: Cardiovascular;  Laterality: N/A;  . KNEE ARTHROSCOPY W/ MENISCAL REPAIR     both knees  . PENILE PROSTHESIS IMPLANT    . REPLACEMENT TOTAL KNEE BILATERAL      Allergies Patient has no known allergies.  Social History Social History  Substance Use Topics  . Smoking status: Never Smoker  . Smokeless tobacco: Not on file  . Alcohol use No    Review of Systems Constitutional: Negative for fever. Cardiovascular: Negative for chest pain. Respiratory: Negative for shortness of breath. Gastrointestinal: Negative for abdominal pain, vomiting and diarrhea. Genitourinary: Negative for dysuria. Musculoskeletal: Negative for back pain. Skin: Negative for rash. Neurological: positive for headache  All systems negative/normal/unremarkable except as  stated in the HPI  ____________________________________________   PHYSICAL EXAM:  VITAL SIGNS: ED Triage Vitals  Enc Vitals Group     BP 07/20/17 1612 (!) 111/56     Pulse Rate 07/20/17 1612 69     Resp 07/20/17 1612 16     Temp 07/20/17 1612 98.1 F (36.7 C)     Temp Source 07/20/17 1612 Oral     SpO2 07/20/17 1612 94 %     Weight 07/20/17 1613 228 lb (103.4 kg)     Height 07/20/17 1613 5\' 10"  (1.778 m)     Head Circumference --      Peak Flow --      Pain Score --      Pain Loc --      Pain Edu? --      Excl. in [place]? --     Constitutional: Alert and oriented. mild distress Eyes: Conjunctivae are normal. Normal extraocular movements. ENT   Head: Normocephalic and atraumatic.   Nose: No congestion/rhinnorhea.   Mouth/Throat: Mucous membranes are moist.   Neck: No stridor. Cardiovascular: Normal rate, regular rhythm. No murmurs, rubs, or gallops. Respiratory: Normal respiratory effort without tachypnea nor retractions. Breath sounds are clear and equal bilaterally. No wheezes/rales/rhonchi. Gastrointestinal: Soft and nontender. Normal bowel sounds Musculoskeletal: Nontender with normal range of motion in extremities. No lower extremity tenderness nor edema. Neurologic:  Normal speech and language. No gross focal neurologic deficits are appreciated.  Skin:  Skin is warm, dry and intact. No rash noted. Psychiatric: Mood and affect are normal. Speech and behavior are normal.  ____________________________________________  EKG: Interpreted by me.sinus  rhythm rate of 70 bpm, prolonged PR interval, wide QRS, right bundle branch block, left anterior fascicular block, LVH  ____________________________________________  ED COURSE:  Pertinent labs & imaging results that were available during my care of the patient were reviewed by me and considered in my medical decision making (see chart for details). Patient presents for weakness with recent gastric sleeve surgery,  we will assess with labs and imaging as indicated. patient appears dehydrated and will receive IV fluids.   Procedures ____________________________________________   LABS (pertinent positives/negatives)  Labs Reviewed  BASIC METABOLIC PANEL - Abnormal; Notable for the following:       Result Value   Potassium 3.2 (*)    Chloride 95 (*)    Glucose, Bld 116 (*)    BUN 47 (*)    Creatinine, Ser 2.44 (*)    Calcium 10.4 (*)    GFR calc non Af Amer 25 (*)    GFR calc Af Amer 29 (*)    Anion gap 16 (*)    All other components within normal limits  CBC - Abnormal; Notable for the following:    WBC 12.0 (*)    All other components within normal limits  GLUCOSE, CAPILLARY - Abnormal; Notable for the following:    Glucose-Capillary 110 (*)    All other components within normal limits  URINALYSIS, COMPLETE (UACMP) WITH MICROSCOPIC  CBG MONITORING, ED    RADIOLOGY Images were viewed by me  CT of abdomen and pelvis with oral contrast IMPRESSION: 1. No acute findings are noted in the abdomen or pelvis to account for the patient's symptoms. 2. Colonic diverticulosis without evidence of acute diverticulitis at this time. 3. Aortic atherosclerosis, in addition to at least right coronary artery disease. Assessment for potential risk factor modification, dietary therapy or pharmacologic therapy may be warranted, if clinically indicated. 4. There are calcifications of the aortic valve. Echocardiographic correlation for evaluation of potential valvular dysfunction may be warranted if clinically indicated. 5. Postoperative changes, as above. 6. Additional incidental findings, as above. ____________________________________________  DIFFERENTIAL DIAGNOSIS   Dehydration, electrolyte abnormalities, postoperative infection, gastric sleeve malfunction, peptic ulcer   FINAL ASSESSMENT AND PLAN  acute renal failure secondary to dehydration   Plan: Patient had presented for weakness  status post gastric sleeve, cholecystectomy and hiatal hernia repair. Patients labs indicate acute on chronic renal insufficiency likely secondary to dehydration. Patients imaging did not reveal any acute intra-abdominal process to account for his symptoms. We have started him on saline infusion as well as morphine for pain related to his headache. I will discuss with the hospitalist for admission.   Earleen Newport, MD   Note: This note was generated in part or whole with voice recognition software. Voice recognition is usually quite accurate but there are transcription errors that can and very often do occur. I apologize for any typographical errors that were not detected and corrected.     Earleen Newport, MD 07/20/17 2010

## 2017-07-20 NOTE — ED Notes (Signed)
Sonja RN, aware of bed assigned  

## 2017-07-20 NOTE — ED Triage Notes (Signed)
Pt had recent abdominal surgery, fatigue, weakness and hypertension at home. Pt alert and oriented X4, active, cooperative, pt in NAD. RR even and unlabored, color WNL.

## 2017-07-20 NOTE — ED Notes (Signed)
Pt transported to room 203. 

## 2017-07-21 LAB — BASIC METABOLIC PANEL
Anion gap: 8 (ref 5–15)
BUN: 41 mg/dL — AB (ref 6–20)
CHLORIDE: 103 mmol/L (ref 101–111)
CO2: 26 mmol/L (ref 22–32)
Calcium: 8.7 mg/dL — ABNORMAL LOW (ref 8.9–10.3)
Creatinine, Ser: 2.07 mg/dL — ABNORMAL HIGH (ref 0.61–1.24)
GFR calc Af Amer: 35 mL/min — ABNORMAL LOW (ref >60)
GFR calc non Af Amer: 30 mL/min — ABNORMAL LOW (ref >60)
GLUCOSE: 92 mg/dL (ref 65–99)
POTASSIUM: 2.9 mmol/L — AB (ref 3.5–5.1)
Sodium: 137 mmol/L (ref 135–145)

## 2017-07-21 LAB — CBC
HEMATOCRIT: 37 % — AB (ref 40.0–52.0)
Hemoglobin: 12.9 g/dL — ABNORMAL LOW (ref 13.0–18.0)
MCH: 31.4 pg (ref 26.0–34.0)
MCHC: 34.8 g/dL (ref 32.0–36.0)
MCV: 90.4 fL (ref 80.0–100.0)
Platelets: 204 10*3/uL (ref 150–440)
RBC: 4.1 MIL/uL — ABNORMAL LOW (ref 4.40–5.90)
RDW: 13.4 % (ref 11.5–14.5)
WBC: 8.9 10*3/uL (ref 3.8–10.6)

## 2017-07-21 LAB — MAGNESIUM: Magnesium: 2.1 mg/dL (ref 1.7–2.4)

## 2017-07-21 MED ORDER — RIVAROXABAN 15 MG PO TABS
15.0000 mg | ORAL_TABLET | Freq: Every day | ORAL | Status: DC
  Administered 2017-07-21: 15 mg via ORAL
  Filled 2017-07-21: qty 1

## 2017-07-21 MED ORDER — POTASSIUM CHLORIDE CRYS ER 20 MEQ PO TBCR
40.0000 meq | EXTENDED_RELEASE_TABLET | Freq: Two times a day (BID) | ORAL | Status: DC
  Administered 2017-07-21 – 2017-07-22 (×3): 40 meq via ORAL
  Filled 2017-07-21 (×3): qty 2

## 2017-07-21 NOTE — Progress Notes (Signed)
MEDICATION RELATED CONSULT NOTE - INITIAL   Pharmacy Consult for electrolytes Indication: hypokalemia   No Known Allergies  Patient Measurements: Height: 5\' 11"  (180.3 cm) Weight: 232 lb 4.8 oz (105.4 kg) IBW/kg (Calculated) : 75.3 Adjusted Body Weight:   Vital Signs: Temp: 97.4 F (36.3 C) (10/16 0428) Temp Source: Oral (10/16 0428) BP: 105/59 (10/16 0428) Pulse Rate: 47 (10/16 0428) Intake/Output from previous day: 10/15 0701 - 10/16 0700 In: 1522 [I.V.:1522] Out: -  Intake/Output from this shift: Total I/O In: 360 [P.O.:360] Out: 175 [Urine:175]  Labs:  Recent Labs  07/20/17 1617 07/21/17 0323  WBC 12.0* 8.9  HGB 15.1 12.9*  HCT 43.2 37.0*  PLT 261 204  CREATININE 2.44* 2.07*  MG 2.2  --    Estimated Creatinine Clearance: 39.8 mL/min (A) (by C-G formula based on SCr of 2.07 mg/dL (H)).   Microbiology: No results found for this or any previous visit (from the past 720 hour(s)).  Medical History: Past Medical History:  Diagnosis Date  . Atrial fibrillation ([place])   . Cancer ([place])   . Hypercholesteremia   . Hypertension   . Neuropathy     Medications:  Prescriptions Prior to Admission  Medication Sig Dispense Refill Last Dose  . amiodarone (PACERONE) 200 MG tablet Take 200 mg by mouth daily.    07/19/2017 at 0800  . amitriptyline (ELAVIL) 25 MG tablet Take 25 mg by mouth every evening.   07/19/2017 at 1800  . amLODipine (NORVASC) 10 MG tablet Take 10 mg by mouth daily.   07/19/2017 at 0800  . Ascorbic Acid (VITAMIN C PO) Take 1 tablet by mouth daily.   07/19/2017 at 0800  . aspirin EC 81 MG tablet Take 81 mg by mouth daily.   07/19/2017 at 0800  . CALCIUM PO Take 1 tablet by mouth daily.   07/19/2017 at 0800  . Cyanocobalamin (VITAMIN B-12 PO) Take 1 tablet by mouth daily.   07/19/2017 at 1800  . diltiazem (CARDIZEM CD) 120 MG 24 hr capsule Take 120 mg by mouth daily.  11 07/19/2017 at 1800  . fexofenadine (ALLEGRA) 180 MG tablet Take 180 mg by mouth  daily.   07/19/2017 at 1800  . furosemide (LASIX) 40 MG tablet Take 40 mg by mouth daily.   07/19/2017 at 1800  . gabapentin (NEURONTIN) 300 MG capsule Take 300 mg by mouth 3 (three) times daily.   07/19/2017 at 1800  . hydrALAZINE (APRESOLINE) 50 MG tablet Take 50 mg by mouth 2 (two) times daily.   07/19/2017 at 0800  . labetalol (NORMODYNE) 200 MG tablet Take 200 mg by mouth 2 (two) times daily.   07/19/2017 at 1800  . omeprazole (PRILOSEC) 20 MG capsule Take 20 mg by mouth daily.   07/19/2017 at 1800  . potassium chloride (K-DUR) 10 MEQ tablet Take 60 mEq by mouth daily.    07/19/2017 at 1800  . pravastatin (PRAVACHOL) 80 MG tablet Take 80 mg by mouth daily.   07/19/2017 at 1800  . pregabalin (LYRICA) 150 MG capsule Take 150 mg by mouth 2 (two) times daily.     . rivaroxaban (XARELTO) 20 MG TABS tablet Take 20 mg by mouth daily with supper.   07/19/2017 at 1800  . sertraline (ZOLOFT) 100 MG tablet Take 100 mg by mouth daily.   07/19/2017 at 1800  . spironolactone-hydrochlorothiazide (ALDACTAZIDE) 25-25 MG tablet Take 1 tablet by mouth daily.   07/19/2017 at 1800  . terazosin (HYTRIN) 10 MG capsule Take 10 mg by  mouth every evening.   07/19/2017 at 1800  . zolpidem (AMBIEN) 10 MG tablet Take 10 mg by mouth daily as needed.   07/19/2017 at 2000  . ALPRAZolam (XANAX) 0.5 MG tablet Take 0.5 mg by mouth at bedtime as needed for anxiety or sleep.    prn at prn  . cyclobenzaprine (FLEXERIL) 10 MG tablet Take 10 mg by mouth 3 (three) times daily as needed.   prn at prn   Scheduled:  . amiodarone  200 mg Oral Daily  . amitriptyline  25 mg Oral QPM  . aspirin EC  81 mg Oral Daily  . diltiazem  120 mg Oral Daily  . gabapentin  300 mg Oral TID  . Influenza vac split quadrivalent PF  0.5 mL Intramuscular Tomorrow-1000  . labetalol  200 mg Oral BID  . loratadine  10 mg Oral Daily  . pantoprazole  40 mg Oral Daily  . potassium chloride  40 mEq Oral BID  . pravastatin  80 mg Oral q1800  . pregabalin   150 mg Oral BID  . rivaroxaban  15 mg Oral Q supper  . sertraline  100 mg Oral Daily  . terazosin  10 mg Oral QPM    Assessment: Pharmacy consulted to manage electrolytes in this 71 year old male K= 2.9  Goal of Therapy:  Normal electrolytes   Plan:  Will give KCl 40 mEq PO BID. Will also check Magnesium   Zonnie Landen D 07/21/2017,10:51 AM

## 2017-07-21 NOTE — Progress Notes (Signed)
ANTICOAGULATION CONSULT NOTE - Initial Consult  Pharmacy Consult for Xarelto dosing Indication: atrial fibrillation  No Known Allergies  Patient Measurements: Height: 5\' 11"  (180.3 cm) Weight: 232 lb 4.8 oz (105.4 kg) IBW/kg (Calculated) : 75.3 Heparin Dosing Weight: n/a  Vital Signs: Temp: 97.4 F (36.3 C) (10/16 0428) Temp Source: Oral (10/16 0428) BP: 105/59 (10/16 0428) Pulse Rate: 47 (10/16 0428)  Labs:  Recent Labs  07/20/17 1617 07/21/17 0323  HGB 15.1 12.9*  HCT 43.2 37.0*  PLT 261 204  CREATININE 2.44* 2.07*    Estimated Creatinine Clearance: 39.8 mL/min (A) (by C-G formula based on SCr of 2.07 mg/dL (H)).   Medical History: Past Medical History:  Diagnosis Date  . Atrial fibrillation ([place])   . Cancer ([place])   . Hypercholesteremia   . Hypertension   . Neuropathy     Medications:  Home dose of Xarelto 20 mg daily  Assessment: TBW CrCl ~48 mL/min  Goal of Therapy:     Plan:  Xarelto 15 mg daily ordered. F/u labs per protocol.  Patrick Yang S 07/21/2017,4:55 AM

## 2017-07-21 NOTE — Progress Notes (Addendum)
[facility] NAME: Patrick Yang    MR#:  [id number]  DATE OF BIRTH:  08-26-1946  SUBJECTIVE:   Patient has HA  REVIEW OF SYSTEMS:    Review of Systems  Constitutional: Negative for fever, chills weight loss HENT: Negative for ear pain, nosebleeds, congestion, facial swelling, rhinorrhea, neck pain, neck stiffness and ear discharge.   Respiratory: Negative for cough, shortness of breath, wheezing  Cardiovascular: Negative for chest pain, palpitations and leg swelling.  Gastrointestinal: Negative for heartburn, abdominal pain, vomiting, diarrhea or consitpation Genitourinary: Negative for dysuria, urgency, frequency, hematuria Musculoskeletal: Negative for back pain or joint pain Neurological: Negative for dizziness, seizures, syncope, focal weakness,  numbness and ++ headaches.  Hematological: Does not bruise/bleed easily.  Psychiatric/Behavioral: Negative for hallucinations, confusion, dysphoric mood    Tolerating Diet: yes      DRUG ALLERGIES:  No Known Allergies  VITALS:  Blood pressure (!) 105/59, pulse (!) 47, temperature (!) 97.4 F (36.3 C), temperature source Oral, resp. rate 18, height 5\' 11"  (1.803 m), weight 105.4 kg (232 lb 4.8 oz), SpO2 97 %.  PHYSICAL EXAMINATION:  Constitutional: Appears well-developed and well-nourished. No distress. HENT: Normocephalic. Marland Kitchen Oropharynx is clear and moist.  Eyes: Conjunctivae and EOM are normal. PERRLA, no scleral icterus.  Neck: Normal ROM. Neck supple. No JVD. No tracheal deviation. CVS: RRR, S1/S2 +, no murmurs, no gallops, no carotid bruit.  Pulmonary: Effort and breath sounds normal, no stridor, rhonchi, wheezes, rales.  Abdominal: Soft. BS +,  no distension, tenderness, rebound or guarding.  Musculoskeletal: Normal range of motion. No edema and no tenderness.  Neuro: Alert. CN 2-12 grossly intact. No focal deficits. Skin: Skin is warm and dry. No rash  noted. Psychiatric: Normal mood and affect.      LABORATORY PANEL:   CBC  Recent Labs Lab 07/21/17 0323  WBC 8.9  HGB 12.9*  HCT 37.0*  PLT 204   ------------------------------------------------------------------------------------------------------------------  Chemistries   Recent Labs Lab 07/20/17 1617 07/21/17 0323  NA 137 137  K 3.2* 2.9*  CL 95* 103  CO2 26 26  GLUCOSE 116* 92  BUN 47* 41*  CREATININE 2.44* 2.07*  CALCIUM 10.4* 8.7*  MG 2.2  --    ------------------------------------------------------------------------------------------------------------------  Cardiac Enzymes No results for input(s): TROPONINI in the last 168 hours. ------------------------------------------------------------------------------------------------------------------  RADIOLOGY:  Ct Abdomen Pelvis Wo Contrast  Result Date: 07/20/2017 CLINICAL DATA:  71 year old male with recent history of gastric bypass surgery, cholecystectomy and hiatal hernia repair 10 days ago, now presenting with fatigue, weakness and hypertension. EXAM: CT ABDOMEN AND PELVIS WITHOUT CONTRAST TECHNIQUE: Multidetector CT imaging of the abdomen and pelvis was performed following the standard protocol without IV contrast. COMPARISON:  None. FINDINGS: Lower chest: Atherosclerotic calcifications in the right coronary artery. Calcifications of the aortic valve. Hepatobiliary: A few well-defined low-attenuation lesions are noted in the liver, incompletely characterized on today's noncontrast CT examination, but likely to represent cysts, measuring up to 2.3 x 1.5 cm between segments 5 and 8. Status post cholecystectomy. Pancreas: No definite pancreatic mass or peripancreatic inflammatory changes are noted on today's noncontrast CT examination. Spleen: Multiple tiny calcified granulomas scattered throughout the spleen. Adrenals/Urinary Tract: Focal area of cortical thinning in the interpolar region of the right kidney  laterally. Subcentimeter low-attenuation lesion in the lower pole the left kidney is incompletely characterized on today's noncontrast CT examination, but is likely a cyst. No hydroureteronephrosis. Urinary bladder is unremarkable in appearance. Bilateral adrenal glands  are normal in appearance. Stomach/Bowel: Postoperative changes of recent sleeve gastrectomy are noted. No pathologic dilatation of small bowel or colon. Numerous colonic diverticulae are noted, particularly in the sigmoid colon, without surrounding inflammatory changes to suggest an acute diverticulitis at this time. Normal appendix. Vascular/Lymphatic: Aortic atherosclerosis, without evidence of aneurysm in the abdominal or pelvic vasculature. No lymphadenopathy noted in the abdomen or pelvis. Reproductive: Brachytherapy implants throughout the prostate gland. Seminal vesicles are unremarkable in appearance. Penile prosthesis with pump reservoir in the left side of the pelvis. Other: No significant volume of ascites.  No pneumoperitoneum. Musculoskeletal: There are no aggressive appearing lytic or blastic lesions noted in the visualized portions of the skeleton. IMPRESSION: 1. No acute findings are noted in the abdomen or pelvis to account for the patient's symptoms. 2. Colonic diverticulosis without evidence of acute diverticulitis at this time. 3. Aortic atherosclerosis, in addition to at least right coronary artery disease. Assessment for potential risk factor modification, dietary therapy or pharmacologic therapy may be warranted, if clinically indicated. 4. There are calcifications of the aortic valve. Echocardiographic correlation for evaluation of potential valvular dysfunction may be warranted if clinically indicated. 5. Postoperative changes, as above. 6. Additional incidental findings, as above. Electronically Signed   By: Vinnie Langton M.D.   On: 07/20/2017 20:02     ASSESSMENT AND PLAN:   71 y/o male with PAF, essential  hypertension and recent gastric sleeve surgery who presents with weakness and found to have hypokalemia and acute kidney injury.  1. Acute kidney injury in the setting of poor by mouth intake along with Lasix,HCTZ and Aldactone with CKD stage 3 Medications have been discontinued for now  Creatinine has shown improvement with IV fluids  2. Hypokalemia with history of chronic hypokalemia: Pharmacy consultation placed to replete potassium Check magnesium level  3. Hyperlipidemia: Continue statin  4. BPH: Continue Hytrin  5. PAF: Continue Xarelto, amiodarone and diltiazem  6. Essential hypertension: HCTZ and Aldactone are discontinued for now due to concerns of acute kidney injury and hypotension Continue diltiazem and labetalolblood pressure is acceptable at this point.  7. Headache: This is due to hypokalemia. Patient reports that he has headaches when his potassium level is low.  PT evaluation for disposition  Management plans discussed with the patient and wife and they are in agreement.  CODE STATUS: FULL  TOTAL TIME TAKING CARE OF THIS PATIENT: 30 minutes.     POSSIBLE D/C tomorrow, DEPENDING ON CLINICAL CONDITION.   Janari Gagner M.D on 07/21/2017 at 11:02 AM  Between 7am to 6pm - Pager - [phone] After 6pm go to [email or web address] - password [email or web address] [facility]  Office  [phone]  CC: Primary care physician; System, Pcp Not In  Note: This dictation was prepared with Dragon dictation along with smaller phrase technology. Any transcriptional errors that result from this process are unintentional.

## 2017-07-21 NOTE — Evaluation (Signed)
Physical Therapy Evaluation Patient Details Name: Patrick Parke Sr. MRN: [id number] DOB: 1945/12/16 Today's Date: 07/21/2017   History of Present Illness  Pt is a 71 y.o.malewith a known history of A. Fib, hypertension, hyperlipidemia, chronic back pain and neuropathy. The patient presented to ED with complaints of general weakness and back pain. The patient recently got gastric sleeve surgery, cholecystectomy and hernia repair in [facility]. He has been feeling weak after surgery. He also complains of chronic back pain. He was found renal failure and hypotension in the ED, given normal saline bolus.  Assessment includes: Acute kidney injury, hypokalemia, HLD, BPH, PAF, HTN, and HA.    Clinical Impression  Pt presents with minor deficits in strength, transfers, mobility, gait, and balance, and moderate deficits in activity tolerance.  Pt has history of low Ka with most recent reading of 2.9.  Pt receiving supplemental Ka at time of PT eval with pt's vital signs and response to activity monitored closely during session.  Pt was SBA with bed mobility tasks with extra time and effort required.  Pt required SBA with transfers with good effort and stability.  Pt able to amb 60' with SBA without AD.  Slow cadence with short B step length but steady during amb.  SpO2 remained at 95% after amb with HR increasing from 48 to 52 bpm with no adverse symptoms.  Pt reports typical HR is in the low 50's.  Pt will benefit from HHPT services upon discharge to safely address above deficits for decreased caregiver assistance and eventual return to PLOF.       Follow Up Recommendations Home health PT    Equipment Recommendations  None recommended by PT    Recommendations for Other Services       Precautions / Restrictions Precautions Precautions: Fall Restrictions Weight Bearing Restrictions: No      Mobility  Bed Mobility Overal bed mobility: Needs Assistance Bed Mobility: Supine to Sit;Sit to  Supine     Supine to sit: Supervision Sit to supine: Supervision   General bed mobility comments: No physical assistance required but pt needed extra time and effort to complete tasks  Transfers Overall transfer level: Needs assistance Equipment used: None             General transfer comment: Pt steady upon initial stand  Ambulation/Gait Ambulation/Gait assistance: Supervision Ambulation Distance (Feet): 60 Feet Assistive device: None Gait Pattern/deviations: Step-through pattern;Decreased step length - right;Decreased step length - left   Gait velocity interpretation: Below normal speed for age/gender General Gait Details: Slow cadence with short B step length but steady during amb.  SpO2 remained at 95% after amb with HR increasing from 48 to 52 bpm with no adverse symptoms.    Stairs Stairs:  (Deferred)          Wheelchair Mobility    Modified Rankin (Stroke Patients Only)       Balance Overall balance assessment: Needs assistance Sitting-balance support: Feet unsupported;Feet supported;No upper extremity supported Sitting balance-Leahy Scale: Normal     Standing balance support: No upper extremity supported Standing balance-Leahy Scale: Good Standing balance comment: Pt steady with eyes closed with feet apart and together and presented with only min instability with eyes closed in semi-tandem all without UE support.                               Pertinent Vitals/Pain Pain Assessment: 0-10 Pain Score: 2  Pain Location:  HA Pain Descriptors / Indicators: Aching Pain Intervention(s): Premedicated before session;Monitored during session    Home Living Family/patient expects to be discharged to:: Private residence Living Arrangements: Spouse/significant other Available Help at Discharge: Family;Available 24 hours/day Type of Home: House Home Access: Stairs to enter Entrance Stairs-Rails: None Entrance Stairs-Number of Steps: 1 small  thershold step Home Layout: Two level;Able to live on main level with bedroom/bathroom Home Equipment: Kasandra Knudsen - single point      Prior Function Level of Independence: Independent         Comments: Pt Ind with amb community distances without AD with no fall history, Ind with ADLs     Hand Dominance   Dominant Hand: Right    Extremity/Trunk Assessment   Upper Extremity Assessment Upper Extremity Assessment: Overall WFL for tasks assessed    Lower Extremity Assessment Lower Extremity Assessment: Generalized weakness       Communication   Communication: No difficulties  Cognition Arousal/Alertness: Awake/alert Behavior During Therapy: WFL for tasks assessed/performed Overall Cognitive Status: Within Functional Limits for tasks assessed                                        General Comments      Exercises Total Joint Exercises Ankle Circles/Pumps: AROM;Both;10 reps Quad Sets: Strengthening;Both;10 reps Gluteal Sets: Strengthening;Both;10 reps Hip ABduction/ADduction: AROM;Both;10 reps Straight Leg Raises: AROM;Both;10 reps Long Arc Quad: AROM;Both;5 reps;10 reps Knee Flexion: AROM;Both;10 reps Marching in Standing: AROM;Both;5 reps Other Exercises Other Exercises: HEP education for BLE APs, GS, and QS x 10-15 each 5-6x/day   Assessment/Plan    PT Assessment Patient needs continued PT services  PT Problem List Decreased strength;Decreased activity tolerance;Decreased balance;Decreased mobility       PT Treatment Interventions Gait training;Stair training;Functional mobility training;Neuromuscular re-education;Balance training;Therapeutic exercise;Therapeutic activities;Patient/family education    PT Goals (Current goals can be found in the Care Plan section)  Acute Rehab PT Goals Patient Stated Goal: To be able to walk greater distances PT Goal Formulation: With patient Time For Goal Achievement: 08/03/17 Potential to Achieve Goals:  Good    Frequency Min 2X/week   Barriers to discharge        Co-evaluation               AM-PAC PT "6 Clicks" Daily Activity  Outcome Measure Difficulty turning over in bed (including adjusting bedclothes, sheets and blankets)?: A Little Difficulty moving from lying on back to sitting on the side of the bed? : A Little Difficulty sitting down on and standing up from a chair with arms (e.g., wheelchair, bedside commode, etc,.)?: None Help needed moving to and from a bed to chair (including a wheelchair)?: None Help needed walking in hospital room?: A Little Help needed climbing 3-5 steps with a railing? : A Little 6 Click Score: 20    End of Session Equipment Utilized During Treatment: Gait belt Activity Tolerance: Patient tolerated treatment well Patient left: in chair;with chair alarm set;with family/visitor present;with call bell/phone within reach Nurse Communication: Mobility status PT Visit Diagnosis: Difficulty in walking, not elsewhere classified (R26.2);Muscle weakness (generalized) (M62.81)    Time: 1696-7893 PT Time Calculation (min) (ACUTE ONLY): 37 min   Charges:   PT Evaluation $PT Eval Low Complexity: 1 Low PT Treatments $Therapeutic Exercise: 8-22 mins   PT G Codes:   PT G-Codes **NOT FOR INPATIENT CLASS** Functional Assessment Tool Used: AM-PAC 6 Clicks Basic  Mobility Functional Limitation: Mobility: Walking and moving around Mobility: Walking and Moving Around Current Status [phone]): At least 20 percent but less than 40 percent impaired, limited or restricted Mobility: Walking and Moving Around Goal Status [phone]): At least 1 percent but less than 20 percent impaired, limited or restricted    D. Royetta Asal PT, DPT 07/21/17, 4:38 PM

## 2017-07-22 LAB — BASIC METABOLIC PANEL
ANION GAP: 4 — AB (ref 5–15)
BUN: 25 mg/dL — ABNORMAL HIGH (ref 6–20)
CALCIUM: 8.8 mg/dL — AB (ref 8.9–10.3)
CO2: 31 mmol/L (ref 22–32)
Chloride: 106 mmol/L (ref 101–111)
Creatinine, Ser: 1.65 mg/dL — ABNORMAL HIGH (ref 0.61–1.24)
GFR calc Af Amer: 46 mL/min — ABNORMAL LOW (ref >60)
GFR calc non Af Amer: 40 mL/min — ABNORMAL LOW (ref >60)
GLUCOSE: 92 mg/dL (ref 65–99)
Potassium: 3.4 mmol/L — ABNORMAL LOW (ref 3.5–5.1)
Sodium: 141 mmol/L (ref 135–145)

## 2017-07-22 LAB — MAGNESIUM: Magnesium: 2.2 mg/dL (ref 1.7–2.4)

## 2017-07-22 MED ORDER — RIVAROXABAN 20 MG PO TABS
20.0000 mg | ORAL_TABLET | Freq: Every day | ORAL | Status: DC
  Filled 2017-07-22: qty 1

## 2017-07-22 MED ORDER — BISACODYL 5 MG PO TBEC: 5.0000 mg | DELAYED_RELEASE_TABLET | Freq: Every day | ORAL | 0 refills | Status: AC | PRN

## 2017-07-22 MED ORDER — POTASSIUM CHLORIDE CRYS ER 20 MEQ PO TBCR
40.0000 meq | EXTENDED_RELEASE_TABLET | Freq: Every day | ORAL | 0 refills | Status: AC
Start: 2017-07-22 — End: ?

## 2017-07-22 MED ORDER — ONDANSETRON HCL 4 MG PO TABS: 4.0000 mg | ORAL_TABLET | Freq: Four times a day (QID) | ORAL | 0 refills | Status: AC | PRN

## 2017-07-22 NOTE — Clinical Social Work Note (Signed)
CSW informed by nurse that PT recommended home health. Patient's nurse asked patient if he wanted home health arranged and he stated he did not. Shela Leff MSW,LCSW [phone]

## 2017-07-22 NOTE — Progress Notes (Signed)
ANTICOAGULATION CONSULT NOTE - Initial Consult  Pharmacy Consult for Xarelto dosing Indication: atrial fibrillation  No Known Allergies  Patient Measurements: Height: 5\' 11"  (180.3 cm) Weight: 232 lb 4.8 oz (105.4 kg) IBW/kg (Calculated) : 75.3 Heparin Dosing Weight: n/a  Vital Signs: Temp: 98.5 F (36.9 C) (10/17 0445) Temp Source: Oral (10/17 0445) BP: 117/65 (10/17 0445) Pulse Rate: 77 (10/17 0445)  Labs:  Recent Labs  07/20/17 1617 07/21/17 0323 07/22/17 0318  HGB 15.1 12.9*  --   HCT 43.2 37.0*  --   PLT 261 204  --   CREATININE 2.44* 2.07* 1.65*    Estimated Creatinine Clearance: 50 mL/min (A) (by C-G formula based on SCr of 1.65 mg/dL (H)).   Medical History: Past Medical History:  Diagnosis Date  . Atrial fibrillation ([place])   . Cancer ([place])   . Hypercholesteremia   . Hypertension   . Neuropathy     Medications:  Home dose of Xarelto 20 mg daily  Assessment: TBW CrCl ~48 mL/min  Goal of Therapy:     Plan:  Xarelto 15 mg daily ordered. F/u labs per protocol. 10/17 Changed to 20 mg daily for improved renal function.  Lakendria Nicastro S 07/22/2017,5:41 AM

## 2017-07-22 NOTE — Progress Notes (Signed)
Pt discharged oper MD order. Pt declined home health. IV removed Discharge paperwork reviewed with pt. Questions answered to pt satisfaction. Pt taken to car in wheelchair by volunteer.

## 2017-07-22 NOTE — Care Management (Signed)
Per CSW note patient has declined home health arrangement. No RNCM needs.

## 2017-07-24 NOTE — Discharge Summary (Signed)
[facility] [place] NAME: Patrick Yang    MR#:  [id number]  DATE OF BIRTH:  12/26/1945  DATE OF ADMISSION:  07/20/2017 ADMITTING PHYSICIAN: Demetrios Loll, MD  DATE OF DISCHARGE: 07/22/2017  4:41 PM  PRIMARY CARE PHYSICIAN: System, Pcp Not In    ADMISSION DIAGNOSIS:  Dehydration [E86.0] Weakness [R53.1] AKI (acute kidney injury) ([place]) [N17.9]  DISCHARGE DIAGNOSIS:  Active Problems:   Renal failure (ARF), acute on chronic ([place])   SECONDARY DIAGNOSIS:   Past Medical History:  Diagnosis Date  . Atrial fibrillation ([place])   . Cancer ([place])   . Hypercholesteremia   . Hypertension   . Neuropathy     HOSPITAL COURSE:   71 y/o male with PAF, essential hypertension and recent gastric sleeve surgery who presents with weakness and found to have hypokalemia and acute kidney injury.  1. Acute kidney injury due to dehydration on CKD stage 3  in the setting of poor by mouth intake along with Lasix,HCTZ and Aldactone with CKD stage 3 Medications have been discontinued for now  Creatinine has shown improvement with IV fluids  2. Hypokalemia with history of chronic hypokalemia: Pharmacy consultation placed to replete potassium Check magnesium level  3. Hyperlipidemia: Continue statin  4. BPH: Continue Hytrin  5. PAF: Continue Xarelto, amiodarone and diltiazem  6. Essential hypertension: HCTZ and Aldactone are discontinued for now due to concerns of acute kidney injury and hypotension Continue diltiazem and labetalolblood pressure is acceptable at this point.  7. Headache: This is due to hypokalemia. Patient reports that he has headaches when his potassium level is low.  DISCHARGE CONDITIONS:   Stable.  CONSULTS OBTAINED:    DRUG ALLERGIES:  No Known Allergies  DISCHARGE MEDICATIONS:   Discharge Medication List as of 07/22/2017 11:56 AM    START taking these medications   Details  bisacodyl (DULCOLAX) 5 MG EC  tablet Take 1 tablet (5 mg total) by mouth daily as needed for moderate constipation., Starting Wed 07/22/2017, Normal    ondansetron (ZOFRAN) 4 MG tablet Take 1 tablet (4 mg total) by mouth every 6 (six) hours as needed for nausea., Starting Wed 07/22/2017, Print    potassium chloride SA (K-DUR,KLOR-CON) 20 MEQ tablet Take 2 tablets (40 mEq total) by mouth daily., Starting Wed 07/22/2017, Print      CONTINUE these medications which have NOT CHANGED   Details  amiodarone (PACERONE) 200 MG tablet Take 200 mg by mouth daily. , Starting Wed 05/23/2015, Historical Med    amitriptyline (ELAVIL) 25 MG tablet Take 25 mg by mouth every evening., Starting Wed 05/23/2015, Historical Med    Ascorbic Acid (VITAMIN C PO) Take 1 tablet by mouth daily., Historical Med    aspirin EC 81 MG tablet Take 81 mg by mouth daily., Historical Med    Cyanocobalamin (VITAMIN B-12 PO) Take 1 tablet by mouth daily., Historical Med    diltiazem (CARDIZEM CD) 120 MG 24 hr capsule Take 120 mg by mouth daily., Starting Fri 09/19/2016, Historical Med    fexofenadine (ALLEGRA) 180 MG tablet Take 180 mg by mouth daily., Historical Med    gabapentin (NEURONTIN) 300 MG capsule Take 300 mg by mouth 3 (three) times daily., Starting Mon 07/23/2015, Until Thu 09/24/2017, Historical Med    labetalol (NORMODYNE) 200 MG tablet Take 200 mg by mouth 2 (two) times daily., Historical Med    omeprazole (PRILOSEC) 20 MG capsule Take 20 mg by mouth daily., Historical Med  pravastatin (PRAVACHOL) 80 MG tablet Take 80 mg by mouth daily., Historical Med    pregabalin (LYRICA) 150 MG capsule Take 150 mg by mouth 2 (two) times daily., Historical Med    rivaroxaban (XARELTO) 20 MG TABS tablet Take 20 mg by mouth daily with supper., Historical Med    sertraline (ZOLOFT) 100 MG tablet Take 100 mg by mouth daily., Historical Med    terazosin (HYTRIN) 10 MG capsule Take 10 mg by mouth every evening., Historical Med    zolpidem (AMBIEN) 10  MG tablet Take 10 mg by mouth daily as needed., Historical Med    ALPRAZolam (XANAX) 0.5 MG tablet Take 0.5 mg by mouth at bedtime as needed for anxiety or sleep. , Historical Med    cyclobenzaprine (FLEXERIL) 10 MG tablet Take 10 mg by mouth 3 (three) times daily as needed., Historical Med      STOP taking these medications     amLODipine (NORVASC) 10 MG tablet      CALCIUM PO      furosemide (LASIX) 40 MG tablet      hydrALAZINE (APRESOLINE) 50 MG tablet      potassium chloride (K-DUR) 10 MEQ tablet      spironolactone-hydrochlorothiazide (ALDACTAZIDE) 25-25 MG tablet          DISCHARGE INSTRUCTIONS:    Follow with PMD in 1-2 weeks.  If you experience worsening of your admission symptoms, develop shortness of breath, life threatening emergency, suicidal or homicidal thoughts you must seek medical attention immediately by calling 911 or calling your MD immediately  if symptoms less severe.  You Must read complete instructions/literature along with all the possible adverse reactions/side effects for all the Medicines you take and that have been prescribed to you. Take any new Medicines after you have completely understood and accept all the possible adverse reactions/side effects.   Please note  You were cared for by a hospitalist during your hospital stay. If you have any questions about your discharge medications or the care you received while you were in the hospital after you are discharged, you can call the unit and asked to speak with the hospitalist on call if the hospitalist that took care of you is not available. Once you are discharged, your primary care physician will handle any further medical issues. Please note that NO REFILLS for any discharge medications will be authorized once you are discharged, as it is imperative that you return to your primary care physician (or establish a relationship with a primary care physician if you do not have one) for your aftercare  needs so that they can reassess your need for medications and monitor your lab values.    Today   CHIEF COMPLAINT:   Chief Complaint  Patient presents with  . Weakness    HISTORY OF PRESENT ILLNESS:  Patrick Yang  is a 71 y.o. male with a known history of A. Fib, hypertension, hyperlipidemia, chronic back pain and neuropathy. The patient presently ED with the above chief complaints. The patient recently got gastric sleeve surgery, cholecystectomy and hernia repair in [facility]. He has been feeling weak after surgery. He also complains of chronic back pain. He was found renal failure and hypotension in the ED, given normal saline bolus.  VITAL SIGNS:  Blood pressure 115/66, pulse (!) 49, temperature 97.9 F (36.6 C), temperature source Oral, resp. rate 16, height 5\' 11"  (1.803 m), weight 105.4 kg (232 lb 4.8 oz), SpO2 98 %.  I/O:  No intake  or output data in the 24 hours ending 07/24/17 0731  PHYSICAL EXAMINATION:   Constitutional: Appears well-developed and well-nourished. No distress. HENT: Normocephalic. Marland Kitchen Oropharynx is clear and moist.  Eyes: Conjunctivae and EOM are normal. PERRLA, no scleral icterus.  Neck: Normal ROM. Neck supple. No JVD. No tracheal deviation. CVS: RRR, S1/S2 +, no murmurs, no gallops, no carotid bruit.  Pulmonary: Effort and breath sounds normal, no stridor, rhonchi, wheezes, rales.  Abdominal: Soft. BS +,  no distension, tenderness, rebound or guarding.  Musculoskeletal: Normal range of motion. No edema and no tenderness.  Neuro: Alert. CN 2-12 grossly intact. No focal deficits. Skin: Skin is warm and dry. No rash noted. Psychiatric: Normal mood and affect.   DATA REVIEW:   CBC  Recent Labs Lab 07/21/17 0323  WBC 8.9  HGB 12.9*  HCT 37.0*  PLT 204    Chemistries   Recent Labs Lab 07/22/17 0318  NA 141  K 3.4*  CL 106  CO2 31  GLUCOSE 92  BUN 25*  CREATININE 1.65*  CALCIUM 8.8*  MG 2.2    Cardiac Enzymes No results  for input(s): TROPONINI in the last 168 hours.  Microbiology Results  No results found for this or any previous visit.  RADIOLOGY:  No results found.  EKG:   Orders placed or performed during the hospital encounter of 07/20/17  . ED EKG  . ED EKG  . EKG      Management plans discussed with the patient, family and they are in agreement.  CODE STATUS:  Code Status History    Date Active Date Inactive Code Status Order ID Comments User Context   07/20/2017 10:01 PM 07/22/2017  7:46 PM Full Code [id number]  Demetrios Loll, MD Inpatient      TOTAL TIME TAKING CARE OF THIS PATIENT: 35 minutes.    Vaughan Basta M.D on 07/24/2017 at 7:31 AM  Between 7am to 6pm - Pager - [phone]  After 6pm go to [email or web address] - password [email or web address] [facility]  Office  [phone]  CC: Primary care physician; System, Pcp Not In   Note: This dictation was prepared with Dragon dictation along with smaller phrase technology. Any transcriptional errors that result from this process are unintentional.

## 2017-07-26 ENCOUNTER — Emergency Department: Payer: Medicare Other

## 2017-07-26 ENCOUNTER — Encounter: Payer: Self-pay | Admitting: Emergency Medicine

## 2017-07-26 ENCOUNTER — Emergency Department
Admission: EM | Admit: 2017-07-26 | Discharge: 2017-07-26 | Disposition: A | Discharge status: Home / Self Care | Payer: Medicare Other | Attending: Emergency Medicine | Admitting: Emergency Medicine

## 2017-07-26 DIAGNOSIS — Z7901 Long term (current) use of anticoagulants: Secondary | ICD-10-CM | POA: Diagnosis not present

## 2017-07-26 DIAGNOSIS — K56699 Other intestinal obstruction unspecified as to partial versus complete obstruction: Secondary | ICD-10-CM | POA: Diagnosis not present

## 2017-07-26 DIAGNOSIS — I10 Essential (primary) hypertension: Secondary | ICD-10-CM | POA: Diagnosis not present

## 2017-07-26 DIAGNOSIS — Z96653 Presence of artificial knee joint, bilateral: Secondary | ICD-10-CM | POA: Diagnosis not present

## 2017-07-26 DIAGNOSIS — N390 Urinary tract infection, site not specified: Secondary | ICD-10-CM | POA: Diagnosis not present

## 2017-07-26 DIAGNOSIS — K5900 Constipation, unspecified: Secondary | ICD-10-CM | POA: Insufficient documentation

## 2017-07-26 DIAGNOSIS — K56609 Unspecified intestinal obstruction, unspecified as to partial versus complete obstruction: Secondary | ICD-10-CM

## 2017-07-26 DIAGNOSIS — Z79899 Other long term (current) drug therapy: Secondary | ICD-10-CM | POA: Insufficient documentation

## 2017-07-26 LAB — COMPREHENSIVE METABOLIC PANEL
ALK PHOS: 57 U/L (ref 38–126)
ALT: 42 U/L (ref 17–63)
AST: 35 U/L (ref 15–41)
Albumin: 4 g/dL (ref 3.5–5.0)
Anion gap: 11 (ref 5–15)
BUN: 15 mg/dL (ref 6–20)
CHLORIDE: 105 mmol/L (ref 101–111)
CO2: 26 mmol/L (ref 22–32)
Calcium: 9.5 mg/dL (ref 8.9–10.3)
Creatinine, Ser: 1.52 mg/dL — ABNORMAL HIGH (ref 0.61–1.24)
GFR calc Af Amer: 51 mL/min — ABNORMAL LOW (ref >60)
GFR, EST NON AFRICAN AMERICAN: 44 mL/min — AB (ref >60)
Glucose, Bld: 93 mg/dL (ref 65–99)
Potassium: 4.2 mmol/L (ref 3.5–5.1)
SODIUM: 142 mmol/L (ref 135–145)
Total Bilirubin: 1.1 mg/dL (ref 0.3–1.2)
Total Protein: 6.7 g/dL (ref 6.5–8.1)

## 2017-07-26 LAB — URINALYSIS, COMPLETE (UACMP) WITH MICROSCOPIC
Bilirubin Urine: NEGATIVE
GLUCOSE, UA: NEGATIVE mg/dL
Hgb urine dipstick: NEGATIVE
Ketones, ur: 5 mg/dL — AB
Leukocytes, UA: NEGATIVE
Nitrite: NEGATIVE
PROTEIN: 100 mg/dL — AB
SPECIFIC GRAVITY, URINE: 1.016 (ref 1.005–1.030)
pH: 6 (ref 5.0–8.0)

## 2017-07-26 LAB — CBC WITH DIFFERENTIAL/PLATELET
BASOS ABS: 0 10*3/uL (ref 0–0.1)
Basophils Relative: 1 %
EOS ABS: 0.1 10*3/uL (ref 0–0.7)
EOS PCT: 2 %
HCT: 39.7 % — ABNORMAL LOW (ref 40.0–52.0)
Hemoglobin: 13.4 g/dL (ref 13.0–18.0)
LYMPHS PCT: 11 %
Lymphs Abs: 0.7 10*3/uL — ABNORMAL LOW (ref 1.0–3.6)
MCH: 31.1 pg (ref 26.0–34.0)
MCHC: 33.8 g/dL (ref 32.0–36.0)
MCV: 91.9 fL (ref 80.0–100.0)
MONO ABS: 0.3 10*3/uL (ref 0.2–1.0)
Monocytes Relative: 6 %
Neutro Abs: 4.9 10*3/uL (ref 1.4–6.5)
Neutrophils Relative %: 80 %
Platelets: 192 10*3/uL (ref 150–440)
RBC: 4.32 MIL/uL — AB (ref 4.40–5.90)
RDW: 13.6 % (ref 11.5–14.5)
WBC: 6.1 10*3/uL (ref 3.8–10.6)

## 2017-07-26 LAB — LIPASE, BLOOD: Lipase: 65 U/L — ABNORMAL HIGH (ref 11–51)

## 2017-07-26 MED ORDER — SODIUM CHLORIDE 0.9 % IV BOLUS (SEPSIS)
1000.0000 mL | Freq: Once | INTRAVENOUS | Status: AC
  Administered 2017-07-26: 1000 mL via INTRAVENOUS

## 2017-07-26 MED ORDER — MAGNESIUM CITRATE PO SOLN: 1.0000 | Freq: Once | ORAL | 0 refills | Status: AC | PRN

## 2017-07-26 MED ORDER — FLEET ENEMA 7-19 GM/118ML RE ENEM
1.0000 | ENEMA | Freq: Once | RECTAL | Status: AC
  Administered 2017-07-26: 1 via RECTAL

## 2017-07-26 MED ORDER — MAGNESIUM CITRATE PO SOLN
1.0000 | Freq: Once | ORAL | Status: AC
  Administered 2017-07-26: 1 via ORAL
  Filled 2017-07-26: qty 296

## 2017-07-26 MED ORDER — CEPHALEXIN 500 MG PO CAPS: 500.0000 mg | ORAL_CAPSULE | Freq: Two times a day (BID) | ORAL | 0 refills | Status: AC

## 2017-07-26 NOTE — ED Notes (Signed)
Patient denies pain and is resting comfortably.  

## 2017-07-26 NOTE — Discharge Instructions (Signed)
Return to the ER for new or worsening abdominal pain, distention, persistent constipation, vomiting, fevers, weakness, or any other new or worsening symptoms that concern you. Follow-up with your primary care doctor and with your surgeon within the next week.

## 2017-07-26 NOTE — ED Notes (Signed)
Patient transported to X-ray 

## 2017-07-26 NOTE — ED Provider Notes (Signed)
[facility] Emergency Department Provider Note ____________________________________________   First MD Initiated Contact with Patient 07/26/17 0920     (approximate)  I have reviewed the triage vital signs and the nursing notes.   HISTORY  Chief Complaint Post-op Problem and Constipation    HPI Patrick Lewers Sr. is a 71 y.o. male with past medical history as noted below and recent history of a gastric sleeve surgery on 10/3 who presents withobstipation over the last 2 weeks, persistent coarse, not relieved by Fleet enemas, MiraLAX, or stool softener pills, and associated with epigastric abdominal pain that occurs when he eats. Patient reports mild distention to the abdomen.  He states he has been only able to take minimal PO due to the pain when he tries to eat or drink.  No vomiting.    Past Medical History:  Diagnosis Date  . Atrial fibrillation ([place])   . Cancer ([place])   . Hypercholesteremia   . Hypertension   . Neuropathy     Patient Active Problem List   Diagnosis Date Noted  . Renal failure (ARF), acute on chronic ([place]) 07/20/2017    Past Surgical History:  Procedure Laterality Date  . ELECTROPHYSIOLOGIC STUDY N/A 10/02/2016   Procedure: CARDIOVERSION;  Surgeon: Teodoro Spray, MD;  Location: [facility] ORS;  Service: Cardiovascular;  Laterality: N/A;  . KNEE ARTHROSCOPY W/ MENISCAL REPAIR     both knees  . PENILE PROSTHESIS IMPLANT    . REPLACEMENT TOTAL KNEE BILATERAL      Prior to Admission medications   Medication Sig Start Date End Date Taking? Authorizing Provider  ALPRAZolam Duanne Moron) 0.5 MG tablet Take 0.5 mg by mouth at bedtime as needed for anxiety or sleep.     [provider]  amiodarone (PACERONE) 200 MG tablet Take 200 mg by mouth daily.  05/23/15   [provider]  amitriptyline (ELAVIL) 25 MG tablet Take 25 mg by mouth every evening. 05/23/15   [provider]  Ascorbic Acid (VITAMIN C PO) Take 1 tablet by  mouth daily.    [provider]  aspirin EC 81 MG tablet Take 81 mg by mouth daily.    [provider]  bisacodyl (DULCOLAX) 5 MG EC tablet Take 1 tablet (5 mg total) by mouth daily as needed for moderate constipation. 07/22/17   Vaughan Basta, MD  cephALEXin (KEFLEX) 500 MG capsule Take 1 capsule (500 mg total) by mouth 2 (two) times daily. 07/26/17 08/05/17  Arta Silence, MD  Cyanocobalamin (VITAMIN B-12 PO) Take 1 tablet by mouth daily.    [provider]  cyclobenzaprine (FLEXERIL) 10 MG tablet Take 10 mg by mouth 3 (three) times daily as needed.    [provider]  diltiazem (CARDIZEM CD) 120 MG 24 hr capsule Take 120 mg by mouth daily. 09/19/16   [provider]  fexofenadine (ALLEGRA) 180 MG tablet Take 180 mg by mouth daily.    [provider]  gabapentin (NEURONTIN) 300 MG capsule Take 300 mg by mouth 3 (three) times daily. 07/23/15 09/24/17  [provider]  labetalol (NORMODYNE) 200 MG tablet Take 200 mg by mouth 2 (two) times daily.    [provider]  magnesium citrate SOLN Take 296 mLs (1 Bottle total) by mouth once as needed for severe constipation. 07/26/17   Arta Silence, MD  omeprazole (PRILOSEC) 20 MG capsule Take 20 mg by mouth daily.    [provider]  ondansetron (ZOFRAN) 4 MG tablet Take 1 tablet (  4 mg total) by mouth every 6 (six) hours as needed for nausea. 07/22/17   Vaughan Basta, MD  potassium chloride SA (K-DUR,KLOR-CON) 20 MEQ tablet Take 2 tablets (40 mEq total) by mouth daily. 07/22/17   Vaughan Basta, MD  pravastatin (PRAVACHOL) 80 MG tablet Take 80 mg by mouth daily.    [provider]  pregabalin (LYRICA) 150 MG capsule Take 150 mg by mouth 2 (two) times daily.    [provider]  rivaroxaban (XARELTO) 20 MG TABS tablet Take 20 mg by mouth daily with supper.    [provider]  sertraline (ZOLOFT) 100 MG tablet Take  100 mg by mouth daily.    [provider]  terazosin (HYTRIN) 10 MG capsule Take 10 mg by mouth every evening.    [provider]  zolpidem (AMBIEN) 10 MG tablet Take 10 mg by mouth daily as needed.    [provider]    Allergies Patient has no known allergies.  Family History  Problem Relation Age of Onset  . Lung cancer Mother   . Lung cancer Father     Social History Social History  Substance Use Topics  . Smoking status: Never Smoker  . Smokeless tobacco: Never Used  . Alcohol use No    Review of Systems  Constitutional: No fever/chills Eyes: No redness.  ENT: No neck pain. Cardiovascular: Denies chest pain. Respiratory: Denies shortness of breath. Gastrointestinal: Positive for abdominal pain and constipation.  Genitourinary: Negative for dysuria, positive for foul odor of urine.  Musculoskeletal: Negative for back pain. Skin: Negative for rash. Neurological: Positive for headache.   ____________________________________________   PHYSICAL EXAM:  VITAL SIGNS: ED Triage Vitals  Enc Vitals Group     BP 07/26/17 0820 (!) 142/71     Pulse Rate 07/26/17 0820 65     Resp 07/26/17 0820 18     Temp 07/26/17 0820 97.7 F (36.5 C)     Temp Source 07/26/17 0820 Oral     SpO2 07/26/17 0820 99 %     Weight 07/26/17 0820 245 lb (111.1 kg)     Height 07/26/17 0820 5\' 11"  (1.803 m)     Head Circumference --      Peak Flow --      Pain Score 07/26/17 0819 5     Pain Loc --      Pain Edu? --      Excl. in [place]? --     Constitutional: Alert and oriented. Well appearing and in no acute distress. Eyes: Conjunctivae are normal.  Head: Atraumatic. Nose: No congestion/rhinnorhea. Mouth/Throat: Mucous membranes are moist.   Neck: Normal range of motion.  Cardiovascular: Good peripheral circulation. Respiratory: Normal respiratory effort.  No retractions.  Gastrointestinal: Soft and nontender. No distention.  Genitourinary: No CVA tenderness.    Musculoskeletal: Extremities warm and well perfused.  Neurologic:  Normal speech and language. No gross focal neurologic deficits are appreciated.  Skin:  Skin is warm and dry. No rash noted. Psychiatric: Mood and affect are normal. Speech and behavior are normal.  ____________________________________________   LABS (all labs ordered are listed, but only abnormal results are displayed)  Labs Reviewed  URINALYSIS, COMPLETE (UACMP) WITH MICROSCOPIC - Abnormal; Notable for the following:       Result Value   Color, Urine YELLOW (*)    APPearance HAZY (*)    Ketones, ur 5 (*)    Protein, ur 100 (*)    Bacteria, UA RARE (*)  Squamous Epithelial / LPF 0-5 (*)    All other components within normal limits  COMPREHENSIVE METABOLIC PANEL - Abnormal; Notable for the following:    Creatinine, Ser 1.52 (*)    GFR calc non Af Amer 44 (*)    GFR calc Af Amer 51 (*)    All other components within normal limits  LIPASE, BLOOD - Abnormal; Notable for the following:    Lipase 65 (*)    All other components within normal limits  CBC WITH DIFFERENTIAL/PLATELET - Abnormal; Notable for the following:    RBC 4.32 (*)    HCT 39.7 (*)    Lymphs Abs 0.7 (*)    All other components within normal limits   ____________________________________________  EKG   ____________________________________________  RADIOLOGY  AXR: Normal bowel gas pattern, no e/o SBO.   ____________________________________________   PROCEDURES  Procedure(s) performed: No    Critical Care performed: No ____________________________________________   INITIAL IMPRESSION / ASSESSMENT AND PLAN / ED COURSE  Pertinent labs & imaging results that were available during my care of the patient were reviewed by me and considered in my medical decision making (see chart for details).  71 year old male status post gastric sleeve on October 3 presents with constipation, as well as intermittent abdominal pain after he eats  and resulting decreased PO intake.  On review of past medical records in [place], patient was admitted on 10/15 for weakness, dehydration and renal insufficiency due to dehydration, discharged on 10/17.  He had negative CT abdomen on 10/15.  Overall differential includes simple constipation after the surgery versus ileus, or much less likely SBO given no significant abdominal distention, no vomiting, and stable course of symptoms over the last week. Patient clinically appears well, but reports similar symptoms to those when he came to the emergency department last time.  Plan: Labs to rule out dehydration or electrolyte abnormality, fluid bolus, abdominal x-ray to screen for stool burden and evaluate for signs of obstruction, UA, and symptomatic treatment for constipation. Based on results of workup I will attempt to contact the patient's surgeon at [facility] or discuss with surgery here if they are unavailable.      ----------------------------------------- 12:09 PM on 07/26/2017 -----------------------------------------  Patient's lab workup is unremarkable; creatinine slightly elevated but this is improved from the last value seen in Epic.  Urinalysis is consistent with UTI. After mag citrate and Fleet enema, patient had a large bowel movement and now reports resolution of symptoms.  He feels well to go home. We will discharge with antibiotic for UTI, and instructions to follow up with his primary care and his surgeon. Return precautions given.  ____________________________________________   FINAL CLINICAL IMPRESSION(S) / ED DIAGNOSES  Final diagnoses:  SBO (small bowel obstruction) (HCC)  Constipation, unspecified constipation type  Urinary tract infection without hematuria, site unspecified      NEW MEDICATIONS STARTED DURING THIS VISIT:  New Prescriptions   CEPHALEXIN (KEFLEX) 500 MG CAPSULE    Take 1 capsule (500 mg total) by mouth 2 (two) times daily.   MAGNESIUM CITRATE SOLN     Take 296 mLs (1 Bottle total) by mouth once as needed for severe constipation.     Note:  This document was prepared using Dragon voice recognition software and may include unintentional dictation errors.    Arta Silence, MD 07/26/17 1215

## 2017-07-26 NOTE — ED Notes (Signed)
AAOx3.  Skin warm and dry. NAD.  Ambulates with easy and steady gait.   

## 2017-07-26 NOTE — ED Triage Notes (Signed)
Patient presents to the ED reports abdominal discomfort and only 1 bowel movement in the past 2 weeks, since his gastric sleeve surgery.  Patient states he is eating a regular diet and has been taking stool softeners, using enemas and miralax with no results.  Patient denies any vomiting or other complications from surgery.  Patient states, "my urine smells terrible."  Denies dysuria or frequency.

## 2017-07-26 NOTE — ED Notes (Signed)
Large results from enema.

## 2017-12-23 ENCOUNTER — Encounter: Payer: Self-pay | Admitting: Emergency Medicine

## 2017-12-23 ENCOUNTER — Emergency Department
Admission: EM | Admit: 2017-12-23 | Discharge: 2017-12-23 | Disposition: A | Discharge status: Home / Self Care | Payer: No Typology Code available for payment source | Attending: Emergency Medicine | Admitting: Emergency Medicine

## 2017-12-23 ENCOUNTER — Emergency Department: Payer: No Typology Code available for payment source

## 2017-12-23 DIAGNOSIS — I10 Essential (primary) hypertension: Secondary | ICD-10-CM | POA: Insufficient documentation

## 2017-12-23 DIAGNOSIS — S39012A Strain of muscle, fascia and tendon of lower back, initial encounter: Secondary | ICD-10-CM | POA: Diagnosis not present

## 2017-12-23 DIAGNOSIS — Z79899 Other long term (current) drug therapy: Secondary | ICD-10-CM | POA: Insufficient documentation

## 2017-12-23 DIAGNOSIS — Y929 Unspecified place or not applicable: Secondary | ICD-10-CM | POA: Insufficient documentation

## 2017-12-23 DIAGNOSIS — Z7982 Long term (current) use of aspirin: Secondary | ICD-10-CM | POA: Diagnosis not present

## 2017-12-23 DIAGNOSIS — S161XXA Strain of muscle, fascia and tendon at neck level, initial encounter: Secondary | ICD-10-CM | POA: Diagnosis not present

## 2017-12-23 DIAGNOSIS — S199XXA Unspecified injury of neck, initial encounter: Secondary | ICD-10-CM | POA: Diagnosis present

## 2017-12-23 DIAGNOSIS — Y939 Activity, unspecified: Secondary | ICD-10-CM | POA: Diagnosis not present

## 2017-12-23 DIAGNOSIS — Y999 Unspecified external cause status: Secondary | ICD-10-CM | POA: Insufficient documentation

## 2017-12-23 DIAGNOSIS — Z859 Personal history of malignant neoplasm, unspecified: Secondary | ICD-10-CM | POA: Diagnosis not present

## 2017-12-23 MED ORDER — TRAMADOL HCL 50 MG PO TABS: 50.0000 mg | ORAL_TABLET | Freq: Two times a day (BID) | ORAL | 0 refills | Status: AC | PRN

## 2017-12-23 MED ORDER — KETOROLAC TROMETHAMINE 60 MG/2ML IM SOLN
30.0000 mg | Freq: Once | INTRAMUSCULAR | Status: AC
  Administered 2017-12-23: 30 mg via INTRAMUSCULAR
  Filled 2017-12-23: qty 2

## 2017-12-23 MED ORDER — CYCLOBENZAPRINE HCL 10 MG PO TABS: 10.0000 mg | ORAL_TABLET | Freq: Every day | ORAL | 0 refills | Status: AC
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
## 2018-01-13 ENCOUNTER — Encounter: Payer: Self-pay | Admitting: Urology

## 2018-01-13 ENCOUNTER — Ambulatory Visit (INDEPENDENT_AMBULATORY_CARE_PROVIDER_SITE_OTHER): Payer: Medicare Other | Admitting: Urology

## 2018-01-13 VITALS — BP 157/83 | HR 64 | Ht 71.0 in | Wt 208.9 lb

## 2018-01-13 DIAGNOSIS — Z8546 Personal history of malignant neoplasm of prostate: Secondary | ICD-10-CM

## 2018-01-13 DIAGNOSIS — R351 Nocturia: Secondary | ICD-10-CM

## 2018-01-13 DIAGNOSIS — N3941 Urge incontinence: Secondary | ICD-10-CM | POA: Diagnosis not present

## 2018-01-13 LAB — BLADDER SCAN AMB NON-IMAGING: Scan Result: 0

## 2018-01-13 MED ORDER — FESOTERODINE FUMARATE ER 4 MG PO TB24: 4.0000 mg | ORAL_TABLET | Freq: Every day | ORAL | 0 refills | Status: DC

## 2018-01-21 ENCOUNTER — Other Ambulatory Visit: Payer: Self-pay | Admitting: Anesthesiology

## 2018-01-21 DIAGNOSIS — M542 Cervicalgia: Secondary | ICD-10-CM

## 2018-01-22 DIAGNOSIS — C61 Malignant neoplasm of prostate: Secondary | ICD-10-CM | POA: Insufficient documentation

## 2018-01-22 DIAGNOSIS — J449 Chronic obstructive pulmonary disease, unspecified: Secondary | ICD-10-CM | POA: Insufficient documentation

## 2018-01-22 DIAGNOSIS — E785 Hyperlipidemia, unspecified: Secondary | ICD-10-CM | POA: Insufficient documentation

## 2018-01-22 DIAGNOSIS — I10 Essential (primary) hypertension: Secondary | ICD-10-CM | POA: Insufficient documentation

## 2018-01-27 ENCOUNTER — Other Ambulatory Visit: Payer: Self-pay

## 2018-01-27 MED ORDER — FESOTERODINE FUMARATE ER 4 MG PO TB24: 4.0000 mg | ORAL_TABLET | Freq: Every day | ORAL | 0 refills | Status: AC

## 2018-01-29 ENCOUNTER — Other Ambulatory Visit (HOSPITAL_COMMUNITY): Payer: Self-pay | Admitting: Anesthesiology

## 2018-01-29 ENCOUNTER — Ambulatory Visit: Admission: RE | Admit: 2018-01-29 | Payer: Medicare Other | Source: Ambulatory Visit

## 2018-01-29 DIAGNOSIS — M542 Cervicalgia: Secondary | ICD-10-CM

## 2018-02-01 ENCOUNTER — Ambulatory Visit: Payer: Medicare Other

## 2018-02-01 ENCOUNTER — Ambulatory Visit: Admission: RE | Admit: 2018-02-01 | Payer: Medicare Other | Source: Ambulatory Visit

## 2018-02-02 ENCOUNTER — Ambulatory Visit (HOSPITAL_COMMUNITY)
Admission: RE | Admit: 2018-02-02 | Discharge: 2018-02-02 | Disposition: A | Discharge status: Home / Self Care | Payer: Medicare Other | Source: Ambulatory Visit | Attending: Anesthesiology | Admitting: Anesthesiology

## 2018-02-02 DIAGNOSIS — M5030 Other cervical disc degeneration, unspecified cervical region: Secondary | ICD-10-CM | POA: Insufficient documentation

## 2018-02-02 DIAGNOSIS — M542 Cervicalgia: Secondary | ICD-10-CM

## 2018-02-02 DIAGNOSIS — M4802 Spinal stenosis, cervical region: Secondary | ICD-10-CM | POA: Diagnosis not present

## 2018-02-02 LAB — CREATININE, SERUM
Creatinine, Ser: 1.68 mg/dL — ABNORMAL HIGH (ref 0.61–1.24)
GFR calc non Af Amer: 39 mL/min — ABNORMAL LOW (ref >60)
GFR, EST AFRICAN AMERICAN: 45 mL/min — AB (ref >60)

## 2018-02-02 MED ORDER — GADOBENATE DIMEGLUMINE 529 MG/ML IV SOLN
10.0000 mL | Freq: Once | INTRAVENOUS | Status: AC | PRN
  Administered 2018-02-02: 10 mL via INTRAVENOUS

## 2018-02-04 ENCOUNTER — Ambulatory Visit: Payer: Medicare Other | Admitting: Urology
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
## 2018-03-10 ENCOUNTER — Encounter: Payer: Self-pay | Admitting: Urology

## 2018-03-10 ENCOUNTER — Ambulatory Visit (INDEPENDENT_AMBULATORY_CARE_PROVIDER_SITE_OTHER): Payer: Medicare Other | Admitting: Urology

## 2018-03-10 VITALS — BP 132/72 | HR 62 | Ht 71.0 in | Wt 197.0 lb

## 2018-03-10 DIAGNOSIS — R8290 Unspecified abnormal findings in urine: Secondary | ICD-10-CM | POA: Diagnosis not present

## 2018-03-10 DIAGNOSIS — Z8546 Personal history of malignant neoplasm of prostate: Secondary | ICD-10-CM

## 2018-03-10 DIAGNOSIS — N3941 Urge incontinence: Secondary | ICD-10-CM

## 2018-03-10 LAB — BLADDER SCAN AMB NON-IMAGING

## 2018-03-10 MED ORDER — MIRABEGRON ER 25 MG PO TB24: 25.0000 mg | ORAL_TABLET | Freq: Every day | ORAL | 0 refills | Status: AC

## 2018-03-11 ENCOUNTER — Telehealth: Payer: Self-pay

## 2018-03-11 LAB — PSA: Prostate Specific Ag, Serum: <0.1 ng/mL (ref 0.0–4.0)

## 2018-03-11 NOTE — Telephone Encounter (Signed)
Pt informed

## 2018-03-11 NOTE — Telephone Encounter (Signed)
-----   Message from Nori Riis, PA-C sent at 03/11/2018  7:34 AM EDT ----- Please let Mr. Bovey know that his PSA is undetectable.

## 2018-03-24 ENCOUNTER — Other Ambulatory Visit: Payer: Self-pay | Admitting: Nephrology

## 2018-03-24 DIAGNOSIS — N183 Chronic kidney disease, stage 3 unspecified: Secondary | ICD-10-CM
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
## 2018-04-01 ENCOUNTER — Ambulatory Visit (INDEPENDENT_AMBULATORY_CARE_PROVIDER_SITE_OTHER): Payer: Medicare Other | Admitting: Urology

## 2018-04-01 ENCOUNTER — Encounter: Payer: Self-pay | Admitting: Urology

## 2018-04-01 ENCOUNTER — Ambulatory Visit
Admission: RE | Admit: 2018-04-01 | Discharge: 2018-04-01 | Disposition: A | Discharge status: Home / Self Care | Payer: Medicare Other | Source: Ambulatory Visit | Attending: Nephrology | Admitting: Nephrology

## 2018-04-01 VITALS — BP 142/70 | HR 60 | Resp 16 | Ht 71.0 in | Wt 194.1 lb

## 2018-04-01 DIAGNOSIS — N3941 Urge incontinence: Secondary | ICD-10-CM

## 2018-04-01 DIAGNOSIS — R351 Nocturia: Secondary | ICD-10-CM | POA: Diagnosis not present

## 2018-04-01 DIAGNOSIS — N183 Chronic kidney disease, stage 3 unspecified: Secondary | ICD-10-CM

## 2018-04-01 DIAGNOSIS — Z8546 Personal history of malignant neoplasm of prostate: Secondary | ICD-10-CM | POA: Diagnosis not present

## 2018-04-01 LAB — BLADDER SCAN AMB NON-IMAGING

## 2018-04-01 NOTE — Patient Instructions (Signed)
Cystoscopy  Cystoscopy is a procedure that is used to help diagnose and sometimes treat conditions that affect that lower urinary tract. The lower urinary tract includes the bladder and the tube that drains urine from the bladder out of the body (urethra). Cystoscopy is performed with a thin, tube-shaped instrument with a light and camera at the end (cystoscope). The cystoscope may be hard (rigid) or flexible, depending on the goal of the procedure.The cystoscope is inserted through the urethra, into the bladder.  Cystoscopy may be recommended if you have:   Urinary tractinfections that keep coming back (recurring).   Blood in the urine (hematuria).   Loss of bladder control (urinary incontinence) or an overactive bladder.   Unusual cells found in a urine sample.   A blockage in the urethra.   Painful urination.   An abnormality in the bladder found during an intravenous pyelogram (IVP) or CT scan.    Cystoscopy may also be done to remove a sample of tissue to be examined under a microscope (biopsy).  Tell a health care provider about:   Any allergies you have.   All medicines you are taking, including vitamins, herbs, eye drops, creams, and over-the-counter medicines.   Any problems you or family members have had with anesthetic medicines.   Any blood disorders you have.   Any surgeries you have had.   Any medical conditions you have.   Whether you are pregnant or may be pregnant.  What are the risks?  Generally, this is a safe procedure. However, problems may occur, including:   Infection.   Bleeding.   Allergic reactions to medicines.   Damage to other structures or organs.    What happens before the procedure?   Ask your health care provider about:  ? Changing or stopping your regular medicines. This is especially important if you are taking diabetes medicines or blood thinners.  ? Taking medicines such as aspirin and ibuprofen. These medicines can thin your blood. Do not take these medicines  before your procedure if your health care provider instructs you not to.   Follow instructions from your health care provider about eating or drinking restrictions.   You may be given antibiotic medicine to help prevent infection.   You may have an exam or testing, such as X-rays of the bladder, urethra, or kidneys.   You may have urine tests to check for signs of infection.   Plan to have someone take you home after the procedure.  What happens during the procedure?   To reduce your risk of infection,your health care team will wash or sanitize their hands.   You will be given one or more of the following:  ? A medicine to help you relax (sedative).  ? A medicine to numb the area (local anesthetic).   The area around the opening of your urethra will be cleaned.   The cystoscope will be passed through your urethra into your bladder.   Germ-free (sterile)fluid will flow through the cystoscope to fill your bladder. The fluid will stretch your bladder so that your surgeon can clearly examine your bladder walls.   The cystoscope will be removed and your bladder will be emptied.  The procedure may vary among health care providers and hospitals.  What happens after the procedure?   You may have some soreness or pain in your abdomen and urethra. Medicines will be available to help you.   You may have some blood in your urine.   Do not   drive for 24 hours if you received a sedative.  This information is not intended to replace advice given to you by your health care provider. Make sure you discuss any questions you have with your health care provider.  Document Released: 09/19/2000 Document Revised: 01/31/2016 Document Reviewed: 08/09/2015  Elsevier Interactive Patient Education © 2018 [employer]  Urodynamic Testing  What is urodynamic testing?  Urodynamic tests are done to determine how well your lower urinary tract  is working. The lower urinary tract includes your bladder and the tube that empties your bladder (urethra).  When your kidneys filter your blood, urine is stored in your bladder until you feel the urge to pass urine (urinate). Urination requires coordination between the nerves and muscles of your bladder and urethra. When your lower urinary tract is working well, you should be able to:  · Start urinating when your bladder is full.  · Empty your bladder completely.  · Control the flow of your urine.    Why do I need urodynamic testing?  You may need urodynamic testing if you:  · Are leaking urine (incontinence).  · Have problems starting or stopping your urine flow.  · Have frequent or painful urination.  · Have frequent urinary tract infections.  · Cannot empty your bladder completely.  · Have strong urges to pass urine (urgency).  · Have a weak flow of urine.    How is urodynamic testing done?  Urodynamic tests may be done separately or all during one testing visit. These tests may be done at your health care provider’s office, a clinic, or a hospital. You may be given an antibiotic medicine before or after testing to prevent infection. Ask your health care provider if you should:  · Stop taking any of your regular medicines.  · Arrive for the test with a full bladder.    The urodynamic tests you may have done include:  Uroflowmetry  This test measures how much urine you pass and how long it takes to pass.  · You sit on a special toilet to urinate.  · The toilet measures the volume and the time of your urine flow.  · These measurements are sent to a computer that creates a graph of your urine flow.    Postvoid residual measurement  This test measures how much urine is left in your bladder after you urinate.  · It uses sound waves (ultrasound) to create an image of your bladder.  · The test can also be done by inserting a thin, flexible tube (catheter) into your bladder after you urinate.   · Remaining urine is measured in milliliters (mL). If you have more than 100 mL left in your bladder after you urinate, your bladder is not emptying as it should.    Cystometric testing  This test uses a special bladder catheter that can measure pressure.  · A numbing medicine (local anesthetic) may be used.  · First, a normal catheter is used to empty your bladder completely.  · Then the measuring catheter is placed, and your bladder is filled with warm, germ-free (sterile) water.  · Pressure measurements will be taken:  ? As your bladder fills.  ? When you feel the need to urinate.  ? As your bladder is emptied.  · You may be asked to cough or bear down to check for leakage.  · In some cases, your bladder may be filled with a material that shows up on X-rays (contrast material) so that X-ray   pictures can be taken during the test.    Electromyography  This test measures the electrical activity of the nerves and muscles of your bladder and the opening of your urethra.  · It tells how well your nerves are communicating with your muscles.  · Sticky patches are placed near your rectum and urethra to measure electrical activity.    What are the risks of this testing?  Generally, these tests are safe. However, problems can occur and include:  · Discomfort.  · Frequent urge to urinate.  · Bleeding.  · Infection.  · An allergic reaction to contrast material, if contrast material is used.    What happens after the testing?  · You should be able to go home right away and do your usual activities.  · You may be instructed to drink a tall glass of water every 30 minutes for the first 2 hours you are home.  · Taking a warm bath or using a warm compress may relieve any discomfort near your urethra.  Let your health care provider know if you have:  · Pain.  · Blood in your urine.  · Chills.  · Fever.    What do my results mean?  Discuss the results of your urodynamic tests with your health care  provider. Your health care provider will use the results of these and other tests, along with your signs and symptoms, to make a diagnosis. Some common causes for abnormal results from urodynamic tests include:  · Enlarged prostate in men.  · Overactive bladder.  · Urinary tract infection.  · Nervous system diseases.  · Spinal cord damage.    This information is not intended to replace advice given to you by your health care provider. Make sure you discuss any questions you have with your health care provider.  Document Released: 07/20/2007 Document Revised: 08/19/2016 Document Reviewed: 01/02/2014  Elsevier Interactive Patient Education © 2017 [employer]

## 2018-05-10 ENCOUNTER — Ambulatory Visit (INDEPENDENT_AMBULATORY_CARE_PROVIDER_SITE_OTHER): Payer: Medicare Other | Admitting: Urology

## 2018-05-10 ENCOUNTER — Encounter: Payer: Self-pay | Admitting: Urology

## 2018-05-10 VITALS — BP 147/77 | HR 58 | Ht 71.0 in | Wt 201.0 lb

## 2018-05-10 DIAGNOSIS — N3941 Urge incontinence: Secondary | ICD-10-CM

## 2018-05-10 LAB — MICROSCOPIC EXAMINATION

## 2018-05-10 LAB — URINALYSIS, COMPLETE
Bilirubin, UA: NEGATIVE
Glucose, UA: NEGATIVE
Ketones, UA: NEGATIVE
Leukocytes, UA: NEGATIVE
Nitrite, UA: NEGATIVE
PH UA: 6 (ref 5.0–7.5)
RBC UA: NEGATIVE
Specific Gravity, UA: 1.02 (ref 1.005–1.030)
UUROB: 2 mg/dL — AB (ref 0.2–1.0)

## 2018-05-10 MED ORDER — LIDOCAINE HCL URETHRAL/MUCOSAL 2 % EX GEL
1.0000 "application " | Freq: Once | CUTANEOUS | Status: AC
  Administered 2018-05-10: 1 via URETHRAL

## 2018-05-10 MED ORDER — CIPROFLOXACIN HCL 500 MG PO TABS
500.0000 mg | ORAL_TABLET | Freq: Once | ORAL | Status: AC
  Administered 2018-05-10: 500 mg via ORAL
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
## 2018-05-27 ENCOUNTER — Other Ambulatory Visit: Payer: Self-pay

## 2018-05-27 DIAGNOSIS — Z1211 Encounter for screening for malignant neoplasm of colon: Secondary | ICD-10-CM

## 2018-06-01 ENCOUNTER — Telehealth: Payer: Self-pay

## 2018-06-01 ENCOUNTER — Other Ambulatory Visit: Payer: Self-pay | Admitting: Urology

## 2018-06-01 NOTE — Telephone Encounter (Signed)
Blood Thinner request was received by fax from [facility] on 05/27/18.  Patient was  informed on 08/22 at 4:26 pm to stop Xarelto 5 days prior to procedure.  Restart 2 days after.  Thanks [employer]

## 2018-06-02 ENCOUNTER — Other Ambulatory Visit: Payer: Self-pay | Admitting: Urology

## 2018-06-14 ENCOUNTER — Telehealth: Payer: Self-pay | Admitting: Gastroenterology

## 2018-06-14 ENCOUNTER — Encounter: Payer: Self-pay | Admitting: Urology

## 2018-06-14 ENCOUNTER — Ambulatory Visit (INDEPENDENT_AMBULATORY_CARE_PROVIDER_SITE_OTHER): Payer: Medicare Other | Admitting: Urology

## 2018-06-14 VITALS — BP 147/77 | HR 59 | Ht 71.0 in | Wt 196.0 lb

## 2018-06-14 DIAGNOSIS — N3946 Mixed incontinence: Secondary | ICD-10-CM

## 2018-06-14 NOTE — Telephone Encounter (Signed)
Pt left vm to cancel procedure for 9/12 he just doe not want to have it done right now he states maybe in the future sometime.
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
## 2018-06-14 NOTE — Telephone Encounter (Signed)
Patients colonoscopy has been canceled for 06/17/18 with Dr Vicente Males per his request.  Thanks Sharyn Lull

## 2018-06-21 ENCOUNTER — Encounter: Admission: RE | Payer: Self-pay | Source: Ambulatory Visit

## 2018-06-21 ENCOUNTER — Ambulatory Visit: Admission: RE | Admit: 2018-06-21 | Payer: Medicare Other | Source: Ambulatory Visit | Admitting: Gastroenterology

## 2018-06-21 ENCOUNTER — Telehealth: Payer: Self-pay | Admitting: Gastroenterology

## 2018-06-21 ENCOUNTER — Other Ambulatory Visit: Payer: Self-pay

## 2018-06-21 DIAGNOSIS — Z1211 Encounter for screening for malignant neoplasm of colon: Secondary | ICD-10-CM

## 2018-06-21 SURGERY — COLONOSCOPY WITH PROPOFOL
Anesthesia: General

## 2018-06-21 NOTE — Progress Notes (Signed)
Patient canceled his colonoscopy for today and called back to reschedule.  He has been rescheduled to 06/29/18 with Dr. Vicente Males.  I reviewed his instructions with him very carefully.  Advised him to stop the Xarelto 5 days before, low fiber 2 and 3 days before and 1 day before clear liquids. Discussed the rx Suprep and provided instructions for how to take it.  I will email his instructions to him and I've asked him to please call me if he has any questions at all to make sure that his colonoscopy goes smoothly.  Thanks [employer]

## 2018-06-21 NOTE — Telephone Encounter (Signed)
Please resend instructions via email

## 2018-06-21 NOTE — Telephone Encounter (Signed)
Patient contacted office states that he did not receive his instructions that were sent to "[email or web address]"  He provided me with a different email and I sent to the other address.  Thanks [employer]

## 2018-06-23 ENCOUNTER — Telehealth: Payer: Self-pay

## 2018-06-23 NOTE — Telephone Encounter (Signed)
Patient contacted me via email to cancel his colonoscopy.  He was advised prior to rescheduling the second procedure that if he cancels again he would not be allowed to reschedule.  I also advised him not to reply to the emailed instructions to contact office directly.  Patient expressed understanding that he will not be allowed to reschedule verbally and in email.  Thanks [employer]

## 2018-06-29 ENCOUNTER — Encounter: Admission: RE | Payer: Self-pay | Source: Ambulatory Visit

## 2018-06-29 ENCOUNTER — Ambulatory Visit: Admission: RE | Admit: 2018-06-29 | Payer: Medicare Other | Source: Ambulatory Visit | Admitting: Gastroenterology

## 2018-06-29 SURGERY — COLONOSCOPY WITH PROPOFOL
Anesthesia: General

## 2018-08-30 IMAGING — CR DG ABDOMEN 2V
3 series · 3 of 3 positions shown · non-contrast
Comparison: None.

CLINICAL DATA: Gastric sleeve, cholecystectomy, and hiatal hernia
repair July 11, 2017. Constipation for 1 week with pain at
incision.

EXAM:
ABDOMEN - 2 VIEW

[abdomen erect]
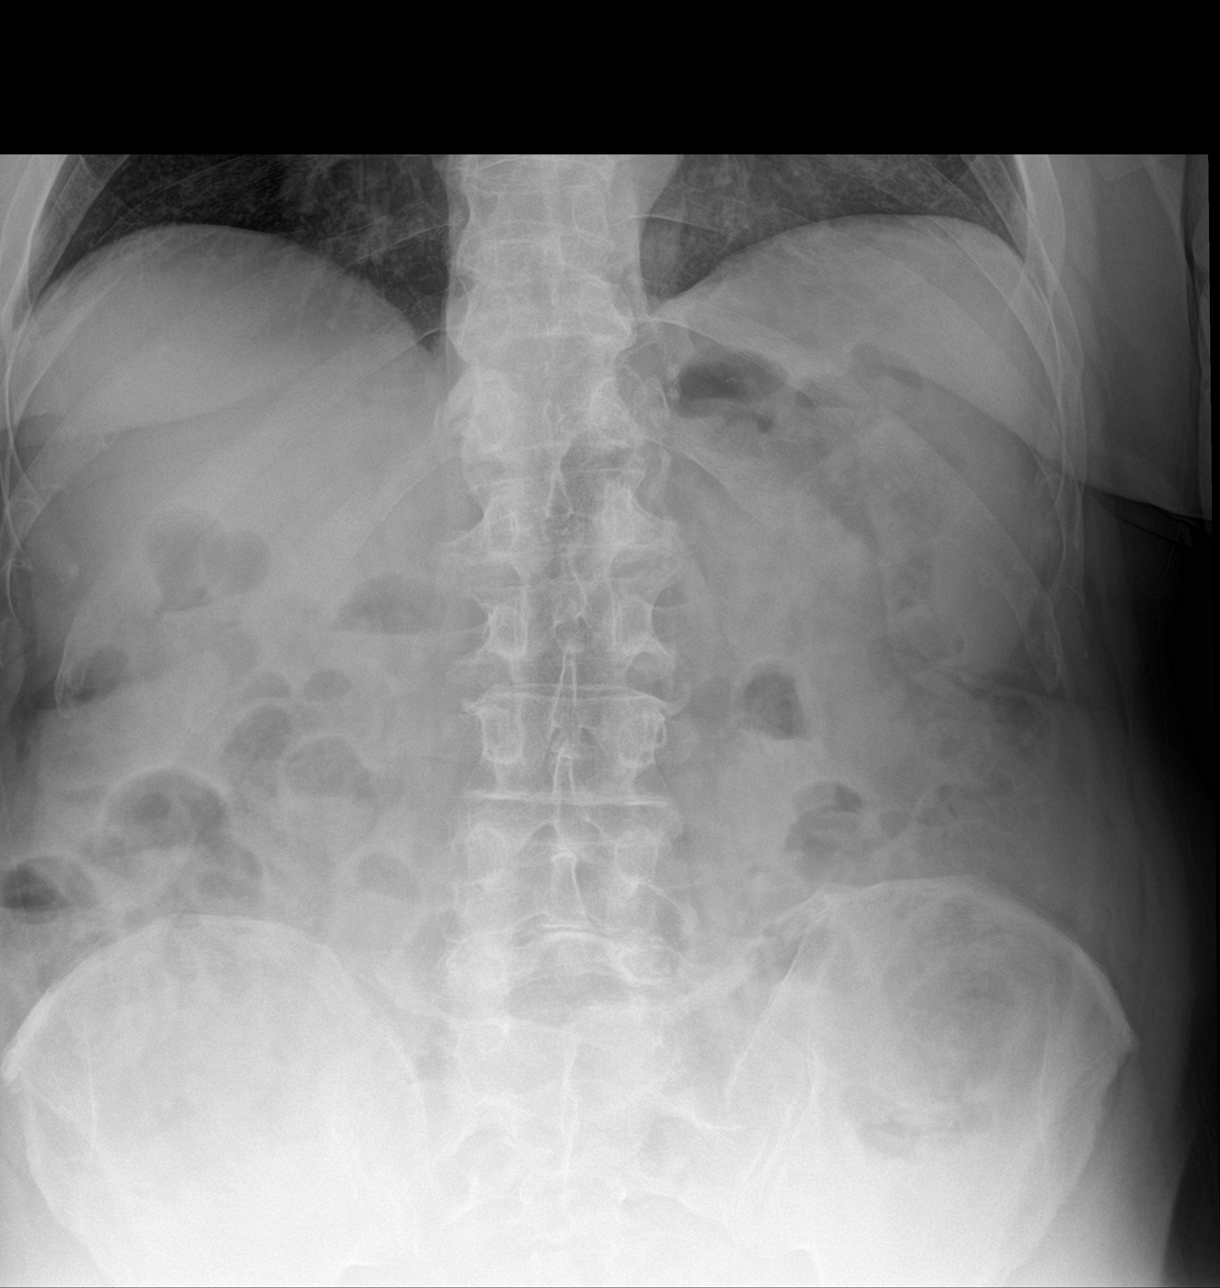

[abdomen supine (1 of 2)]
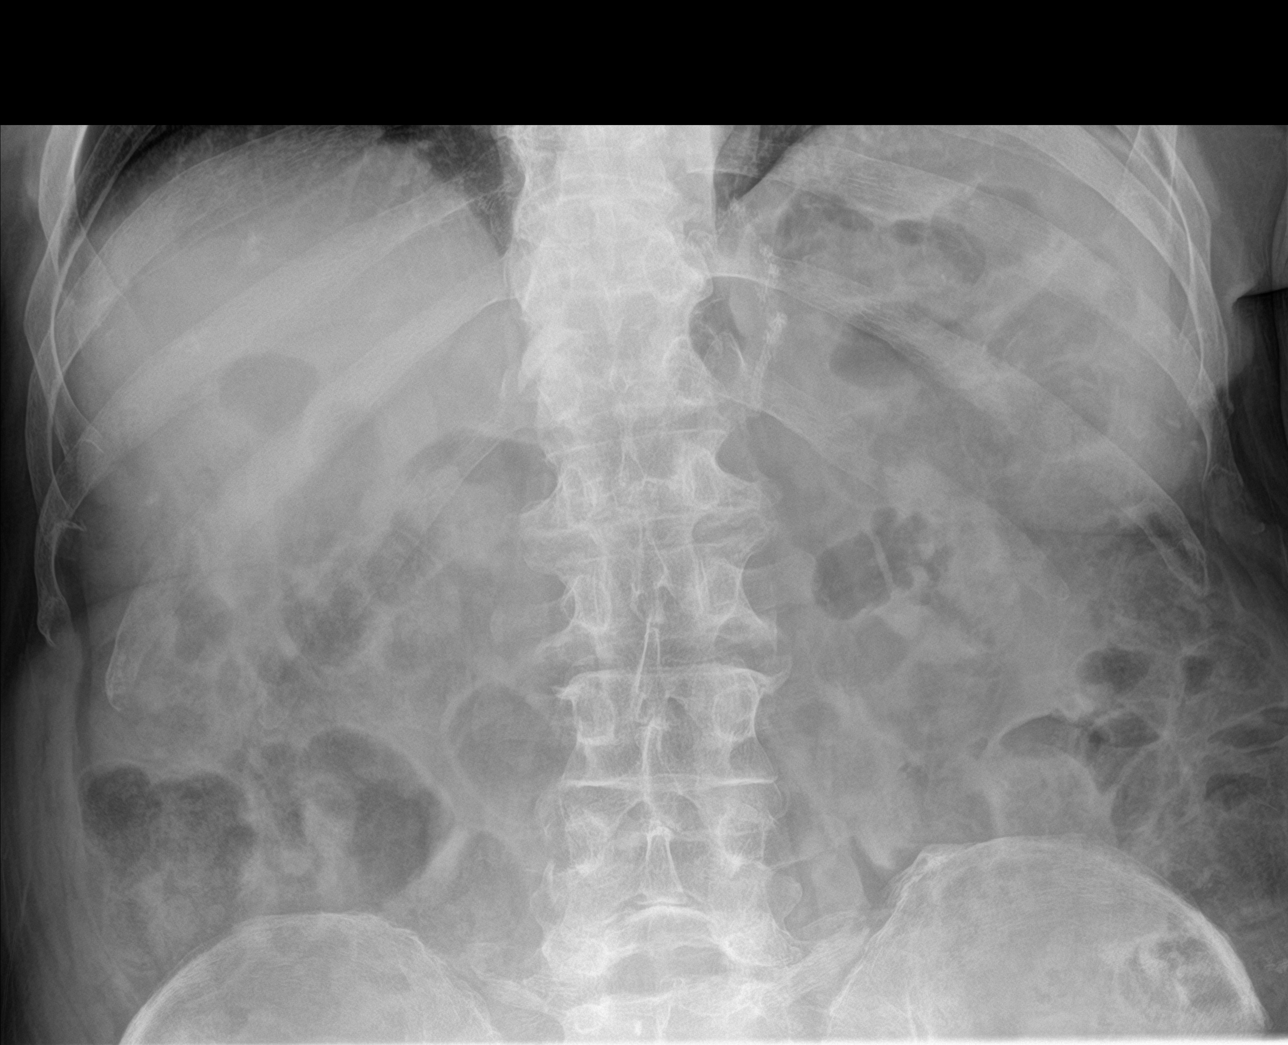

[abdomen supine (2 of 2)]
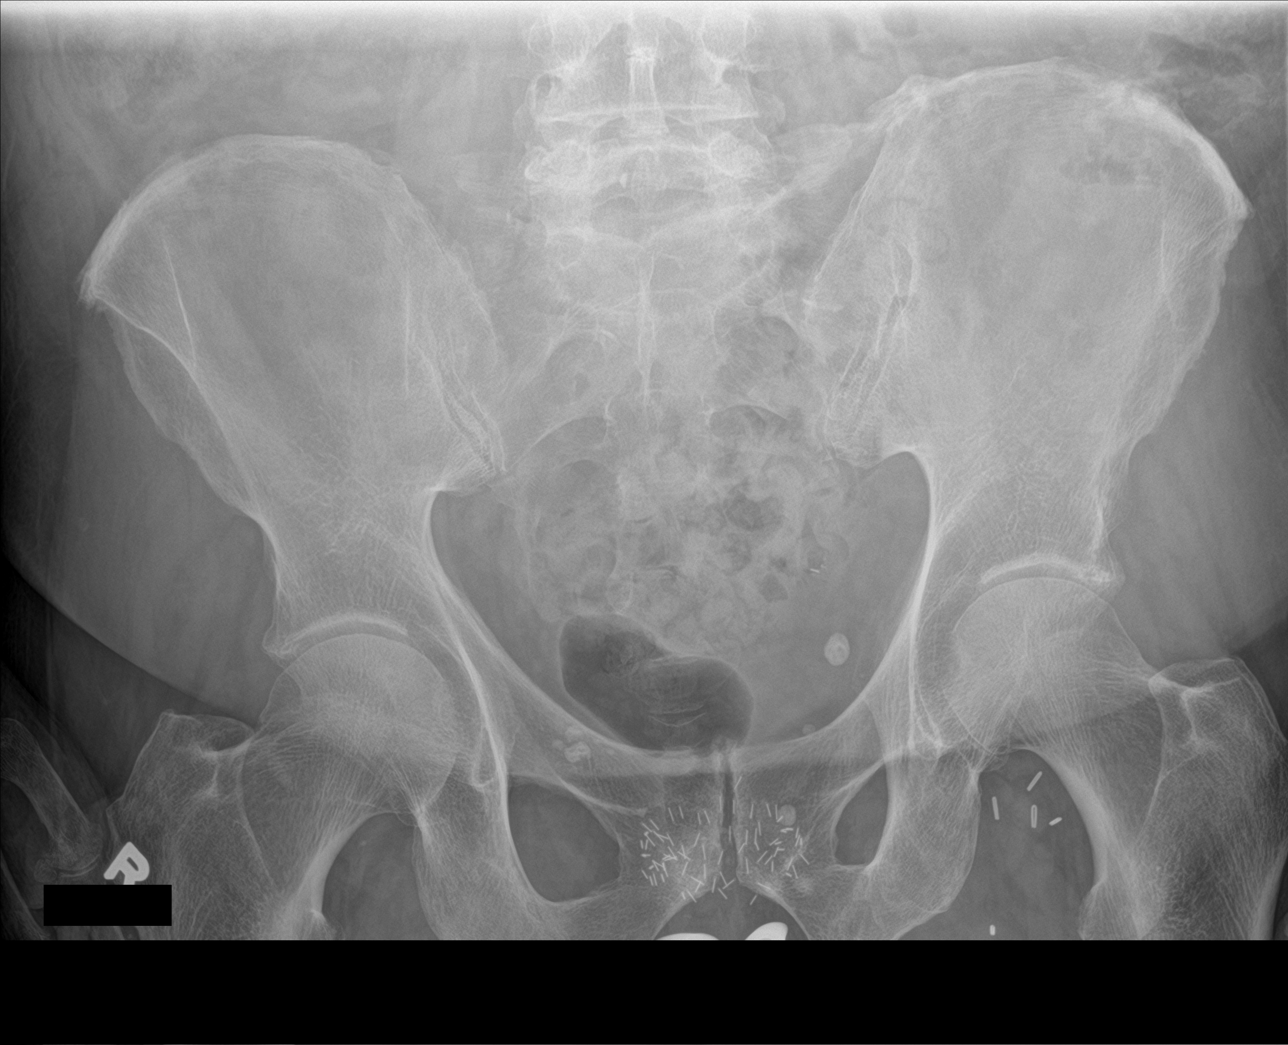

[3 of 3 positions shown; findings below may reference images not displayed]

FINDINGS: The bowel gas pattern is normal. There is no evidence of free air.
No radio-opaque calculi or other significant radiographic
abnormality is seen.
IMPRESSION: Negative.
